# Patient Record
Sex: Female | Born: 1935 | Race: White | Hispanic: No | Marital: Single
Health system: Southern US, Community
[De-identification: ages and names within clinical notes are randomized; demographics above are authoritative.]

## PROBLEM LIST (undated history)

## (undated) DIAGNOSIS — F32A Depression, unspecified: Secondary | ICD-10-CM

## (undated) DIAGNOSIS — K219 Gastro-esophageal reflux disease without esophagitis: Secondary | ICD-10-CM

## (undated) DIAGNOSIS — F0390 Unspecified dementia without behavioral disturbance: Secondary | ICD-10-CM

## (undated) DIAGNOSIS — I639 Cerebral infarction, unspecified: Secondary | ICD-10-CM

## (undated) DIAGNOSIS — R1310 Dysphagia, unspecified: Secondary | ICD-10-CM

## (undated) DIAGNOSIS — H3530 Unspecified macular degeneration: Secondary | ICD-10-CM

## (undated) DIAGNOSIS — F329 Major depressive disorder, single episode, unspecified: Secondary | ICD-10-CM

## (undated) DIAGNOSIS — I10 Essential (primary) hypertension: Secondary | ICD-10-CM

## (undated) DIAGNOSIS — C801 Malignant (primary) neoplasm, unspecified: Secondary | ICD-10-CM

## (undated) DIAGNOSIS — J449 Chronic obstructive pulmonary disease, unspecified: Secondary | ICD-10-CM

## (undated) DIAGNOSIS — I4891 Unspecified atrial fibrillation: Secondary | ICD-10-CM

---

## 2004-03-15 ENCOUNTER — Inpatient Hospital Stay: Payer: Self-pay | Admitting: Surgery

## 2004-03-15 ENCOUNTER — Other Ambulatory Visit: Payer: Self-pay

## 2004-09-05 ENCOUNTER — Ambulatory Visit: Payer: Self-pay | Admitting: Ophthalmology

## 2005-05-03 ENCOUNTER — Ambulatory Visit: Payer: Self-pay | Admitting: Unknown Physician Specialty

## 2005-07-11 ENCOUNTER — Ambulatory Visit: Payer: Self-pay | Admitting: Family Medicine

## 2005-07-25 ENCOUNTER — Ambulatory Visit: Payer: Self-pay | Admitting: Family Medicine

## 2006-07-04 ENCOUNTER — Inpatient Hospital Stay: Payer: Self-pay | Admitting: Internal Medicine

## 2006-07-04 ENCOUNTER — Other Ambulatory Visit: Payer: Self-pay

## 2006-07-13 ENCOUNTER — Inpatient Hospital Stay: Payer: Self-pay | Admitting: Internal Medicine

## 2007-04-25 ENCOUNTER — Ambulatory Visit: Payer: Self-pay | Admitting: Family Medicine

## 2007-06-07 ENCOUNTER — Ambulatory Visit: Payer: Self-pay | Admitting: Ophthalmology

## 2007-06-13 ENCOUNTER — Ambulatory Visit: Payer: Self-pay | Admitting: Unknown Physician Specialty

## 2008-07-12 ENCOUNTER — Inpatient Hospital Stay: Payer: Self-pay | Admitting: Internal Medicine

## 2008-12-13 ENCOUNTER — Inpatient Hospital Stay: Payer: Self-pay | Admitting: Internal Medicine

## 2008-12-13 IMAGING — CR DG CHEST 1V PORT
1 series · 1 of 1 positions shown · non-contrast
Comparison: none

REASON FOR EXAM: sob  chest pain  [HOSPITAL]
COMMENTS:

PROCEDURE:     DXR - DXR PORTABLE CHEST SINGLE VIEW  - July 04, 2006  [DATE]
RESULT:     An AP portable exam reveals the cardiomediastinal structures to
be within normal limits.  The lung fields are clear. Vascularity is within
normal limits with no effusions present.

[view not recorded]
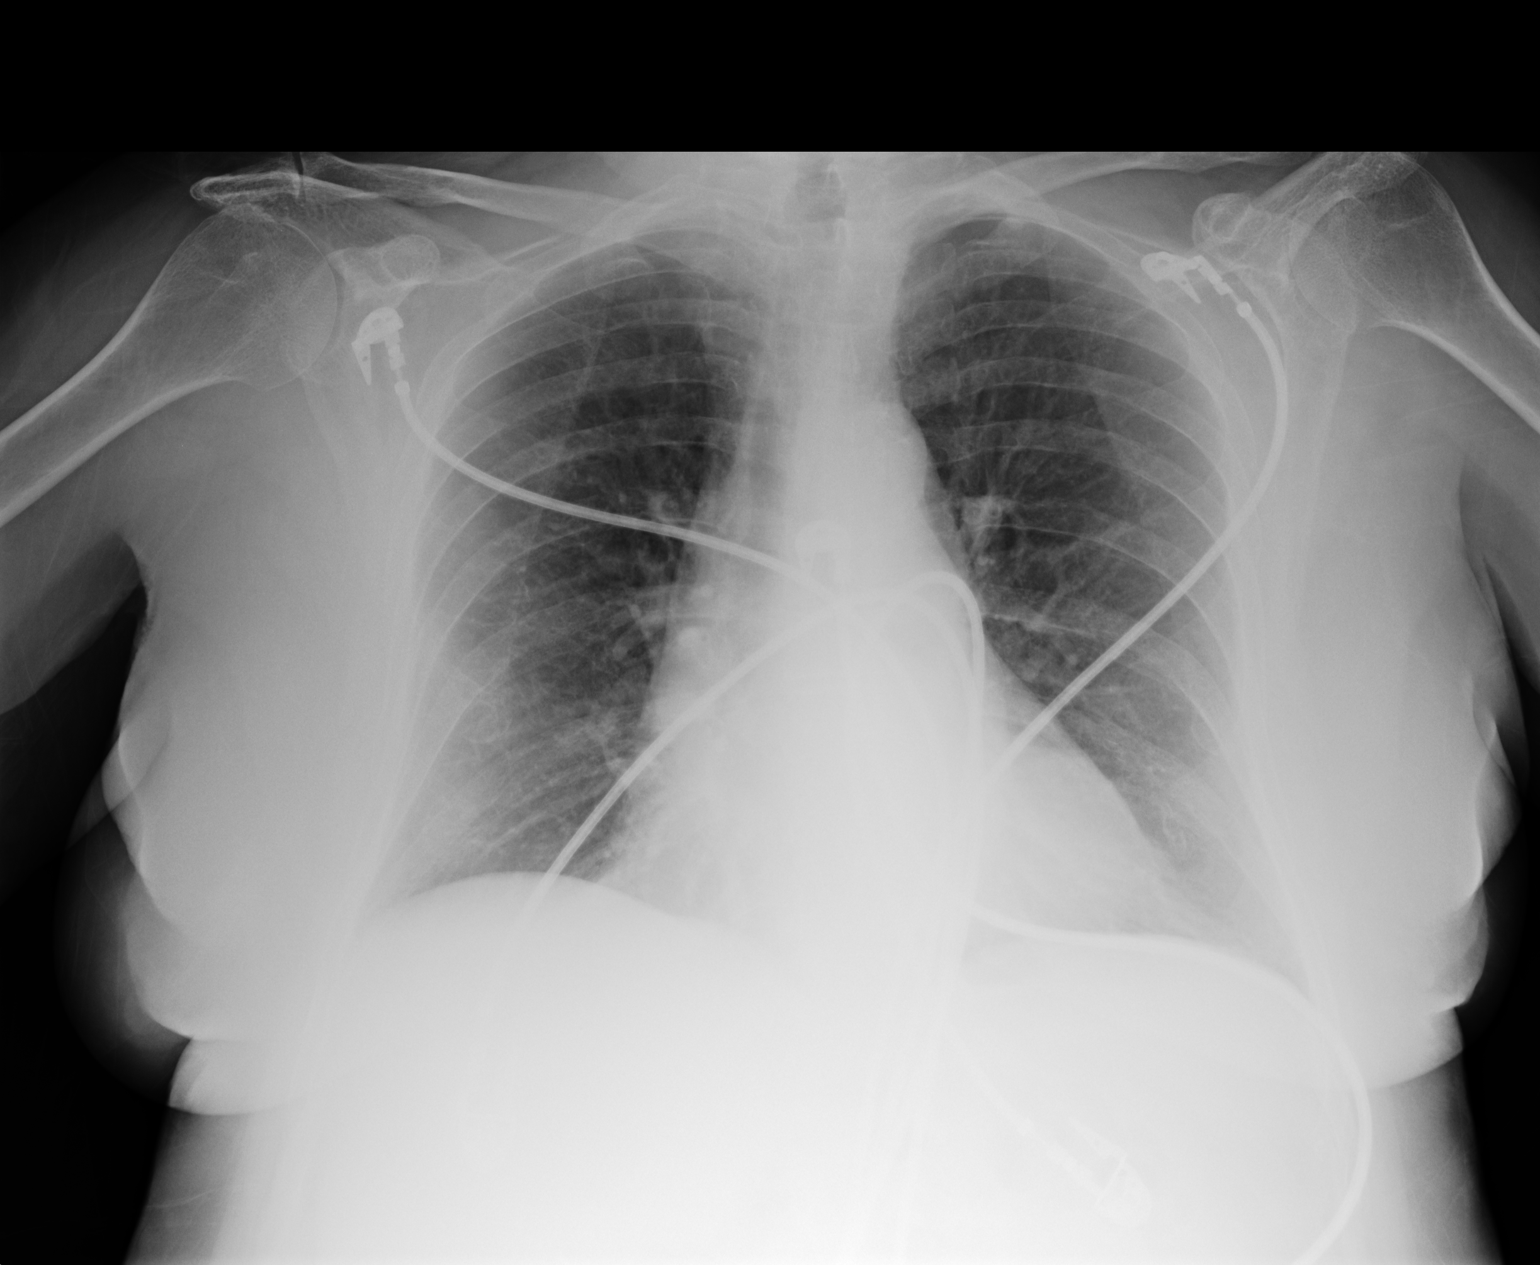

[1 of 1 positions shown; findings below may reference images not displayed]

IMPRESSION: 1)No acute cardiopulmonary disease identified.

## 2010-04-05 ENCOUNTER — Emergency Department: Payer: Self-pay | Admitting: Emergency Medicine

## 2012-10-09 ENCOUNTER — Emergency Department: Payer: Self-pay | Admitting: Emergency Medicine

## 2012-10-09 LAB — BASIC METABOLIC PANEL
BUN: 24 mg/dL — ABNORMAL HIGH (ref 7–18)
Calcium, Total: 9.3 mg/dL (ref 8.5–10.1)
Chloride: 95 mmol/L — ABNORMAL LOW (ref 98–107)
Co2: 32 mmol/L (ref 21–32)
EGFR (African American): 32 — ABNORMAL LOW
Glucose: 222 mg/dL — ABNORMAL HIGH (ref 65–99)
Potassium: 3.2 mmol/L — ABNORMAL LOW (ref 3.5–5.1)
Sodium: 135 mmol/L — ABNORMAL LOW (ref 136–145)

## 2012-10-09 LAB — URINALYSIS, COMPLETE
Bacteria: NONE SEEN
Bilirubin,UR: NEGATIVE
Hyaline Cast: 1
Leukocyte Esterase: NEGATIVE
Nitrite: NEGATIVE
Ph: 6 (ref 4.5–8.0)
Protein: 30
RBC,UR: 2 /HPF (ref 0–5)
Squamous Epithelial: NONE SEEN

## 2012-10-09 LAB — HEPATIC FUNCTION PANEL A (ARMC)
Albumin: 3.3 g/dL — ABNORMAL LOW (ref 3.4–5.0)
Bilirubin,Total: 0.3 mg/dL (ref 0.2–1.0)
SGOT(AST): 43 U/L — ABNORMAL HIGH (ref 15–37)
SGPT (ALT): 24 U/L (ref 12–78)
Total Protein: 8 g/dL (ref 6.4–8.2)

## 2012-10-09 LAB — CBC
HCT: 35.3 % (ref 35.0–47.0)
HGB: 12.1 g/dL (ref 12.0–16.0)
MCH: 33.4 pg (ref 26.0–34.0)
MCHC: 34.2 g/dL (ref 32.0–36.0)
MCV: 98 fL (ref 80–100)
Platelet: 433 10*3/uL (ref 150–440)
WBC: 13.6 10*3/uL — ABNORMAL HIGH (ref 3.6–11.0)

## 2014-12-23 ENCOUNTER — Encounter: Payer: Self-pay | Admitting: Emergency Medicine

## 2014-12-23 ENCOUNTER — Emergency Department
Admission: EM | Admit: 2014-12-23 | Discharge: 2014-12-24 | Disposition: A | Discharge status: Home / Self Care | Payer: Medicare PPO | Attending: Emergency Medicine | Admitting: Emergency Medicine

## 2014-12-23 DIAGNOSIS — R339 Retention of urine, unspecified: Secondary | ICD-10-CM | POA: Insufficient documentation

## 2014-12-23 DIAGNOSIS — R944 Abnormal results of kidney function studies: Secondary | ICD-10-CM | POA: Diagnosis not present

## 2014-12-23 DIAGNOSIS — R3914 Feeling of incomplete bladder emptying: Secondary | ICD-10-CM | POA: Diagnosis not present

## 2014-12-23 DIAGNOSIS — F0390 Unspecified dementia without behavioral disturbance: Secondary | ICD-10-CM | POA: Diagnosis not present

## 2014-12-23 DIAGNOSIS — I10 Essential (primary) hypertension: Secondary | ICD-10-CM | POA: Insufficient documentation

## 2014-12-23 DIAGNOSIS — R251 Tremor, unspecified: Secondary | ICD-10-CM | POA: Diagnosis present

## 2014-12-23 DIAGNOSIS — R338 Other retention of urine: Secondary | ICD-10-CM

## 2014-12-23 DIAGNOSIS — Z880 Allergy status to penicillin: Secondary | ICD-10-CM | POA: Diagnosis not present

## 2014-12-23 HISTORY — DX: Cerebral infarction, unspecified: I63.9

## 2014-12-23 HISTORY — DX: Essential (primary) hypertension: I10

## 2014-12-23 HISTORY — DX: Chronic obstructive pulmonary disease, unspecified: J44.9

## 2014-12-23 HISTORY — DX: Gastro-esophageal reflux disease without esophagitis: K21.9

## 2014-12-23 HISTORY — DX: Major depressive disorder, single episode, unspecified: F32.9

## 2014-12-23 HISTORY — DX: Malignant (primary) neoplasm, unspecified: C80.1

## 2014-12-23 HISTORY — DX: Unspecified atrial fibrillation: I48.91

## 2014-12-23 HISTORY — DX: Dysphagia, unspecified: R13.10

## 2014-12-23 HISTORY — DX: Unspecified macular degeneration: H35.30

## 2014-12-23 HISTORY — DX: Unspecified dementia, unspecified severity, without behavioral disturbance, psychotic disturbance, mood disturbance, and anxiety: F03.90

## 2014-12-23 HISTORY — DX: Depression, unspecified: F32.A

## 2014-12-23 LAB — CBC WITH DIFFERENTIAL/PLATELET
BASOS ABS: 0 10*3/uL (ref 0–0.1)
Basophils Relative: 1 %
EOS PCT: 2 %
Eosinophils Absolute: 0.1 10*3/uL (ref 0–0.7)
HEMATOCRIT: 35.2 % (ref 35.0–47.0)
Hemoglobin: 11.6 g/dL — ABNORMAL LOW (ref 12.0–16.0)
LYMPHS PCT: 22 %
Lymphs Abs: 1.5 10*3/uL (ref 1.0–3.6)
MCH: 33.7 pg (ref 26.0–34.0)
MCHC: 32.9 g/dL (ref 32.0–36.0)
MCV: 102.5 fL — AB (ref 80.0–100.0)
Monocytes Absolute: 0.8 10*3/uL (ref 0.2–0.9)
Monocytes Relative: 12 %
Neutro Abs: 4.6 10*3/uL (ref 1.4–6.5)
Neutrophils Relative %: 63 %
Platelets: 319 10*3/uL (ref 150–440)
RBC: 3.43 MIL/uL — AB (ref 3.80–5.20)
RDW: 15.9 % — AB (ref 11.5–14.5)
WBC: 7.1 10*3/uL (ref 3.6–11.0)

## 2014-12-23 LAB — URINALYSIS COMPLETE WITH MICROSCOPIC (ARMC ONLY)
Bacteria, UA: NONE SEEN
Bilirubin Urine: NEGATIVE
GLUCOSE, UA: NEGATIVE mg/dL
HGB URINE DIPSTICK: NEGATIVE
Ketones, ur: NEGATIVE mg/dL
Leukocytes, UA: NEGATIVE
Nitrite: NEGATIVE
PH: 5 (ref 5.0–8.0)
Protein, ur: NEGATIVE mg/dL
RBC / HPF: NONE SEEN RBC/hpf (ref 0–5)
SQUAMOUS EPITHELIAL / LPF: NONE SEEN
Specific Gravity, Urine: 1.014 (ref 1.005–1.030)

## 2014-12-23 LAB — BASIC METABOLIC PANEL
Anion gap: 10 (ref 5–15)
BUN: 31 mg/dL — AB (ref 6–20)
CO2: 30 mmol/L (ref 22–32)
Calcium: 8.8 mg/dL — ABNORMAL LOW (ref 8.9–10.3)
Chloride: 99 mmol/L — ABNORMAL LOW (ref 101–111)
Creatinine, Ser: 1.8 mg/dL — ABNORMAL HIGH (ref 0.44–1.00)
GFR, EST AFRICAN AMERICAN: 27 mL/min — AB (ref >60)
GFR, EST NON AFRICAN AMERICAN: 23 mL/min — AB (ref >60)
Glucose, Bld: 166 mg/dL — ABNORMAL HIGH (ref 65–99)
Potassium: 4.8 mmol/L (ref 3.5–5.1)
Sodium: 139 mmol/L (ref 135–145)

## 2014-12-23 MED ORDER — METOPROLOL TARTRATE 25 MG PO TABS
25.0000 mg | ORAL_TABLET | Freq: Once | ORAL | Status: AC
  Administered 2014-12-23: 25 mg via ORAL

## 2014-12-23 MED ORDER — METOPROLOL TARTRATE 25 MG PO TABS
ORAL_TABLET | ORAL | Status: AC
  Filled 2014-12-23: qty 1

## 2014-12-23 MED ORDER — SODIUM CHLORIDE 0.9 % IV BOLUS (SEPSIS)
1000.0000 mL | Freq: Once | INTRAVENOUS | Status: AC
  Administered 2014-12-23: 1000 mL via INTRAVENOUS

## 2014-12-23 MED ORDER — SODIUM CHLORIDE 0.9 % IV SOLN
Freq: Once | INTRAVENOUS | Status: AC
  Administered 2014-12-23: 20:00:00 via INTRAVENOUS

## 2014-12-23 NOTE — ED Notes (Signed)
Pts niece Manuela Schwartz called and informed of pts D/C.

## 2014-12-23 NOTE — ED Provider Notes (Signed)
[facility] Emergency Department Provider Note    ____________________________________________  Time seen: 1500  I have reviewed the triage vital signs and the nursing notes.   HISTORY  Chief Complaint Tremors and Urinary Retention   History limited by: dementia   HPI Janet Smith is a 81 y.o. female who presents to the emergency department today from nursing facility because of concerns for altered mental status and decreased urination. Per report patient has not urinated in the past 24 hours. Additionally the patient has developed tremors throughout her body for the past 2 days. Patient's niece states that when she saw her 3 days ago the patient seemed to be doing okay. The patient does not appear to be in any pain. No recent fevers appreciated.  Past Medical History  Diagnosis Date  . Hypertension   . A-fib   . GERD (gastroesophageal reflux disease)   . Chronic airway obstruction   . Dysphagia   . CVA (cerebral infarction)   . Cancer     colon hx  . Macular degeneration   . Depression   . Dementia     There are no active problems to display for this patient.   History reviewed. No pertinent past surgical history.  No current outpatient prescriptions on file.  Allergies Codeine; Eggs or egg-derived products; Fish allergy; Influenza vaccines; Penicillins; and Sulfa antibiotics  History reviewed. No pertinent family history.  Social History Social History  Substance Use Topics  . Smoking status: Unknown If Ever Smoked  . Smokeless tobacco: None  . Alcohol Use: None    Review of Systems Unable to obtain secondary to dementia  ____________________________________________   PHYSICAL EXAM:  VITAL SIGNS: ED Triage Vitals  Enc Vitals Group     BP 12/23/14 1353 125/89 mmHg     Pulse Rate 12/23/14 1353 54     Resp 12/23/14 1353 18     Temp 12/23/14 1353 98.4 F (36.9 C)     Temp Source 12/23/14 1353 Oral     SpO2 12/23/14 1353  99 %   Constitutional: Awake and alert. Appears to be in no pain. Eyes: Conjunctivae are normal. PERRL. Normal extraocular movements. ENT   Head: Normocephalic and atraumatic.   Nose: No congestion/rhinnorhea.   Mouth/Throat: Mucous membranes are moist.   Neck: No stridor. Hematological/Lymphatic/Immunilogical: No cervical lymphadenopathy. Cardiovascular: Normal rate, regular rhythm.  No murmurs, rubs, or gallops. Respiratory: Normal respiratory effort without tachypnea nor retractions. Breath sounds are clear and equal bilaterally. No wheezes/rales/rhonchi. Gastrointestinal: Soft and nontender. No distention.  Genitourinary: Deferred Musculoskeletal: Normal range of motion in all extremities. No joint effusions.  No lower extremity tenderness nor edema. Neurologic:  Demented. Nonverbal. Tremors in all extremities appreciated. Skin:  Skin is warm, dry and intact. No rash noted. Psychiatric: Mood and affect are normal. Speech and behavior are normal. Patient exhibits appropriate insight and judgment.  ____________________________________________    LABS (pertinent positives/negatives)  Cr: 1.8 WBC: 7.1  ____________________________________________   EKG  None  ____________________________________________    RADIOLOGY  None  ____________________________________________   PROCEDURES  Procedure(s) performed: None  Critical Care performed: No  ____________________________________________   INITIAL IMPRESSION / ASSESSMENT AND PLAN / ED COURSE  Pertinent labs & imaging results that were available during my care of the patient were reviewed by me and considered in my medical decision making (see chart for details).  Patient presented to the emergency department today with concerns for urinary retention and tremors. On exam patient appears in no  distress. No distended bladder appreciated. Patient was given fluids and multiple bladder scans were performed.  At the time of sign out we were still waiting patient to urinate. Creatinine appears to be roughly patient's baseline.  ____________________________________________   FINAL CLINICAL IMPRESSION(S) / ED DIAGNOSES  Final diagnoses:  Acute urinary retention     Nance Pear, MD 12/24/14 [phone]

## 2014-12-23 NOTE — Discharge Instructions (Signed)
Acute Urinary Retention °Acute urinary retention is the temporary inability to urinate. This is an uncommon problem in women. It can be caused by: °· Infection. °· A side effect of a medicine. °· A problem in a nearby organ that presses or squeezes on the bladder or the urethra (the tube that drains the bladder). °· Psychological problems. °·  Surgery on your bladder, urethra, or pelvic organs that causes obstruction to the outflow of urine from your bladder. °HOME CARE INSTRUCTIONS  °If you are sent home with a Foley catheter and a drainage system, you will need to discuss the best course of action with your health care provider. While the catheter is in, maintain a good intake of fluids. Keep the drainage bag emptied and lower than your catheter. This is so that contaminated urine will not flow back into your bladder, which could lead to a urinary tract infection. °There are two main types of drainage bags. One is a large bag that usually is used at night. It has a good capacity that will allow you to sleep through the night without having to empty it. The second type is called a leg bag. It has a smaller capacity so it needs to be emptied more frequently. However, the main advantage is that it can be attached by a leg strap and goes underneath your clothing, allowing you the freedom to move about or leave your home. °Only take over-the-counter or prescription medicines for pain, discomfort, or fever as directed by your health care provider.  °SEEK MEDICAL CARE IF: °· You develop a low-grade fever. °· You experience spasms or leakage of urine with the spasms. °SEEK IMMEDIATE MEDICAL CARE IF:  °· You develop chills or fever. °· Your catheter stops draining urine. °· Your catheter falls out. °· You start to develop increased bleeding that does not respond to rest and increased fluid intake. °MAKE SURE YOU: °· Understand these instructions. °· Will watch your condition. °· Will get help right away if you are not  doing well or get worse. °Document Released: 04/30/2006 Document Revised: 02/19/2013 Document Reviewed: 10/10/2012 °[employer]® Patient Information ©2015 [employer]. This information is not intended to replace advice given to you by your health care provider. Make sure you discuss any questions you have with your health care provider. ° °

## 2014-12-23 NOTE — Consult Note (Signed)
Consult: Urinary retention Requested by: Dr. Archie Balboa  History of Present Illness: 81 year old white female with dementia who is noted to have decreased urine output and possibly no voiding for about 24 hours. Her chemistry showed a slight bump in her BUN and creatinine indicating possible prerenal causes. She was given 2 L of fluid and had not voided. Nurses tried to catheter 4. Her bladder scan was about 500. She was altered over to the commode and sat down. She tried to void and voided about 50 mL.  Past Medical History  Diagnosis Date  . Hypertension   . A-fib   . GERD (gastroesophageal reflux disease)   . Chronic airway obstruction   . Dysphagia   . CVA (cerebral infarction)   . Cancer     colon hx  . Macular degeneration   . Depression   . Dementia    History reviewed. No pertinent past surgical history.  Home Medications:   (Not in a hospital admission) Allergies:  Allergies  Allergen Reactions  . Codeine Other (See Comments)    Reaction:  Unknown   . Eggs Or Egg-Derived Products Other (See Comments)    Reaction:  Unknown   . Fish Allergy Other (See Comments)    Reaction:  Unknown   . Influenza Vaccines Other (See Comments)    Reaction:  Unknown   . Penicillins Other (See Comments)    Reaction:  Unknown   . Sulfa Antibiotics Other (See Comments)    Reaction:  Unknown     History reviewed. No pertinent family history. Social History:  has no tobacco, alcohol, and drug history on file.  ROS: A complete review of systems was performed.  All systems are negative except for pertinent findings as noted. ROS   Physical Exam:  Vital signs in last 24 hours: Temp:  [98.4 F (36.9 C)] 98.4 F (36.9 C) (08/10 1851) Pulse Rate:  [52-142] 52 (08/10 2045) Resp:  [16-24] 17 (08/10 2100) BP: (110-217)/(66-170) 217/133 mmHg (08/10 2100) SpO2:  [84 %-100 %] 100 % (08/10 2045) General:  Alert and oriented, No acute distress HEENT: Normocephalic, atraumatic Neck: No JVD  or lymphadenopathy Cardiovascular: Regular rate and rhythm Lungs: Regular rate and effort Abdomen: Soft, nontender, nondistended, no abdominal masses Back: No CVA tenderness Extremities: No edema Neurologic: Grossly intact GU: 2 nurses and one tech assisted and were chaperones, findings: Typical severe atrophic vaginitis some retraction of urethral meatus under pubic bone. The vulva was otherwise normal in appearance without lesion or mass. The introitus was quite tight. With one finger in the vagina she was prepped and a 16 French catheter was inserted without difficulty. The meatus was palpably normal. The bladder and vagina were palpably normal. About 400 mL of clear urine was drained and the catheter was removed.   Laboratory Data:  Results for orders placed or performed during the hospital encounter of 12/23/14 (from the past 24 hour(s))  CBC with Differential     Status: Abnormal   Collection Time: 12/23/14  3:40 PM  Result Value Ref Range   WBC 7.1 3.6 - 11.0 K/uL   RBC 3.43 (L) 3.80 - 5.20 MIL/uL   Hemoglobin 11.6 (L) 12.0 - 16.0 g/dL   HCT 35.2 35.0 - 47.0 %   MCV 102.5 (H) 80.0 - 100.0 fL   MCH 33.7 26.0 - 34.0 pg   MCHC 32.9 32.0 - 36.0 g/dL   RDW 15.9 (H) 11.5 - 14.5 %   Platelets 319 150 - 440 K/uL  Neutrophils Relative % 63 %   Neutro Abs 4.6 1.4 - 6.5 K/uL   Lymphocytes Relative 22 %   Lymphs Abs 1.5 1.0 - 3.6 K/uL   Monocytes Relative 12 %   Monocytes Absolute 0.8 0.2 - 0.9 K/uL   Eosinophils Relative 2 %   Eosinophils Absolute 0.1 0 - 0.7 K/uL   Basophils Relative 1 %   Basophils Absolute 0.0 0 - 0.1 K/uL  Basic metabolic panel     Status: Abnormal   Collection Time: 12/23/14  3:40 PM  Result Value Ref Range   Sodium 139 135 - 145 mmol/L   Potassium 4.8 3.5 - 5.1 mmol/L   Chloride 99 (L) 101 - 111 mmol/L   CO2 30 22 - 32 mmol/L   Glucose, Bld 166 (H) 65 - 99 mg/dL   BUN 31 (H) 6 - 20 mg/dL   Creatinine, Ser 1.80 (H) 0.44 - 1.00 mg/dL   Calcium 8.8 (L) 8.9  - 10.3 mg/dL   GFR calc non Af Amer 23 (L) >60 mL/min   GFR calc Af Amer 27 (L) >60 mL/min   Anion gap 10 5 - 15   No results found for this or any previous visit (from the past 240 hour(s)). Creatinine:  Recent Labs  12/23/14 1540  CREATININE 1.80*    Impression/Assessment/plan: Infrequent voiding, possible incomplete bladder emptying-prior to and and out cath patient did not appear to be in any acute distress or discomfort. Her bump in creatinine may been simply due to some dehydration. Also, her normal bladder function may include an elevated post void that does not cause her any trouble. I do not feel like we need to leave a catheter in at the current time. A urinalysis and urine culture will be sent. Patient needs to be encouraged to drink fluids and stay hydrated. She may need repeat in and out cath if she decompensates with obvious acute renal failure, hydronephrosis, UTI, or become symptomatic (urgency to void with bladder pain and inability to urinate) and a significantly elevated PVR. She has normal anatomy and I/O cath should not be an issue.    Lelaina Oatis 12/23/2014, 10:10 PM

## 2014-12-23 NOTE — ED Notes (Signed)
Pt here via EMS from [facility] where she was noted to have a tremor today and was reported to have not urinated for 24 hours. Pt with baseline dementia and mobility probems, NAD, VSS and afebrile. Alert to self and location.

## 2014-12-23 NOTE — ED Provider Notes (Signed)
-----------------------------------------   10:22 PM on 12/23/2014 -----------------------------------------  Patient has been seen by urology, and an in and out catheter was performed with approximately 400 cc of urine removed. Kidney function appears within normal limits. I discussed with urology, they do not recommend an indwelling Foley catheter at this time. We'll discharge the patient home, with the understanding that the patient is to return to the department if she is unable to urinate, developed abdominal pain. Patient/family are agreeable to this plan.  Harvest Dark, MD 12/23/14 2223

## 2015-03-24 ENCOUNTER — Encounter: Payer: Self-pay | Admitting: Emergency Medicine

## 2015-03-24 ENCOUNTER — Emergency Department: Payer: Medicare PPO

## 2015-03-24 ENCOUNTER — Emergency Department
Admission: EM | Admit: 2015-03-24 | Discharge: 2015-03-25 | Disposition: A | Discharge status: Home Health | Payer: Medicare PPO | Attending: Emergency Medicine | Admitting: Emergency Medicine

## 2015-03-24 DIAGNOSIS — Z7901 Long term (current) use of anticoagulants: Secondary | ICD-10-CM | POA: Insufficient documentation

## 2015-03-24 DIAGNOSIS — I10 Essential (primary) hypertension: Secondary | ICD-10-CM | POA: Diagnosis not present

## 2015-03-24 DIAGNOSIS — Z880 Allergy status to penicillin: Secondary | ICD-10-CM | POA: Diagnosis not present

## 2015-03-24 DIAGNOSIS — Z7951 Long term (current) use of inhaled steroids: Secondary | ICD-10-CM | POA: Insufficient documentation

## 2015-03-24 DIAGNOSIS — N390 Urinary tract infection, site not specified: Secondary | ICD-10-CM | POA: Insufficient documentation

## 2015-03-24 DIAGNOSIS — F911 Conduct disorder, childhood-onset type: Secondary | ICD-10-CM | POA: Diagnosis not present

## 2015-03-24 DIAGNOSIS — Z79899 Other long term (current) drug therapy: Secondary | ICD-10-CM | POA: Insufficient documentation

## 2015-03-24 DIAGNOSIS — R4182 Altered mental status, unspecified: Secondary | ICD-10-CM | POA: Insufficient documentation

## 2015-03-24 DIAGNOSIS — R109 Unspecified abdominal pain: Secondary | ICD-10-CM | POA: Diagnosis present

## 2015-03-24 LAB — COMPREHENSIVE METABOLIC PANEL
ALT: 13 U/L — AB (ref 14–54)
AST: 24 U/L (ref 15–41)
Albumin: 3.2 g/dL — ABNORMAL LOW (ref 3.5–5.0)
Alkaline Phosphatase: 60 U/L (ref 38–126)
Anion gap: 3 — ABNORMAL LOW (ref 5–15)
BUN: 15 mg/dL (ref 6–20)
CHLORIDE: 105 mmol/L (ref 101–111)
CO2: 31 mmol/L (ref 22–32)
Calcium: 8.5 mg/dL — ABNORMAL LOW (ref 8.9–10.3)
Creatinine, Ser: 1.19 mg/dL — ABNORMAL HIGH (ref 0.44–1.00)
GFR calc Af Amer: 45 mL/min — ABNORMAL LOW (ref >60)
GFR calc non Af Amer: 38 mL/min — ABNORMAL LOW (ref >60)
GLUCOSE: 86 mg/dL (ref 65–99)
POTASSIUM: 4 mmol/L (ref 3.5–5.1)
Sodium: 139 mmol/L (ref 135–145)
Total Bilirubin: 0.4 mg/dL (ref 0.3–1.2)
Total Protein: 6.2 g/dL — ABNORMAL LOW (ref 6.5–8.1)

## 2015-03-24 LAB — CBC WITH DIFFERENTIAL/PLATELET
Basophils Absolute: 0.1 10*3/uL (ref 0–0.1)
Basophils Relative: 1 %
EOS ABS: 0.2 10*3/uL (ref 0–0.7)
EOS PCT: 2 %
HCT: 30.7 % — ABNORMAL LOW (ref 35.0–47.0)
Hemoglobin: 10.2 g/dL — ABNORMAL LOW (ref 12.0–16.0)
LYMPHS ABS: 1.8 10*3/uL (ref 1.0–3.6)
Lymphocytes Relative: 20 %
MCH: 34.9 pg — AB (ref 26.0–34.0)
MCHC: 33.3 g/dL (ref 32.0–36.0)
MCV: 104.8 fL — ABNORMAL HIGH (ref 80.0–100.0)
Monocytes Absolute: 1.5 10*3/uL — ABNORMAL HIGH (ref 0.2–0.9)
Monocytes Relative: 17 %
Neutro Abs: 5.3 10*3/uL (ref 1.4–6.5)
Neutrophils Relative %: 60 %
PLATELETS: 247 10*3/uL (ref 150–440)
RBC: 2.93 MIL/uL — AB (ref 3.80–5.20)
RDW: 14.7 % — AB (ref 11.5–14.5)
WBC: 8.8 10*3/uL (ref 3.6–11.0)

## 2015-03-24 LAB — LIPASE, BLOOD: Lipase: 41 U/L (ref 11–51)

## 2015-03-24 LAB — TROPONIN I
TROPONIN I: 0.03 ng/mL (ref <0.031)
Troponin I: 0.03 ng/mL (ref <0.031)

## 2015-03-24 LAB — URINALYSIS COMPLETE WITH MICROSCOPIC (ARMC ONLY)
Bacteria, UA: NONE SEEN
Bilirubin Urine: NEGATIVE
GLUCOSE, UA: NEGATIVE mg/dL
Hgb urine dipstick: NEGATIVE
Ketones, ur: NEGATIVE mg/dL
Nitrite: NEGATIVE
Protein, ur: NEGATIVE mg/dL
SPECIFIC GRAVITY, URINE: 1.005 (ref 1.005–1.030)
Squamous Epithelial / LPF: NONE SEEN
pH: 6 (ref 5.0–8.0)

## 2015-03-24 LAB — PROTIME-INR
INR: 2.19
PROTHROMBIN TIME: 24.2 s — AB (ref 11.4–15.0)

## 2015-03-24 LAB — LACTIC ACID, PLASMA: LACTIC ACID, VENOUS: 1.6 mmol/L (ref 0.5–2.0)

## 2015-03-24 MED ORDER — HALOPERIDOL 5 MG PO TABS
5.0000 mg | ORAL_TABLET | Freq: Once | ORAL | Status: AC
  Administered 2015-03-24: 5 mg via ORAL
  Filled 2015-03-24: qty 1

## 2015-03-24 MED ORDER — IOHEXOL 240 MG/ML SOLN
25.0000 mL | Freq: Once | INTRAMUSCULAR | Status: AC | PRN
  Administered 2015-03-24: 25 mL via ORAL

## 2015-03-24 MED ORDER — HALOPERIDOL 0.5 MG PO TABS
0.5000 mg | ORAL_TABLET | Freq: Once | ORAL | Status: AC
  Administered 2015-03-24: 0.5 mg via ORAL
  Filled 2015-03-24: qty 1

## 2015-03-24 MED ORDER — LORAZEPAM 1 MG PO TABS
1.0000 mg | ORAL_TABLET | Freq: Once | ORAL | Status: AC
  Administered 2015-03-24: 1 mg via ORAL

## 2015-03-24 MED ORDER — IOHEXOL 300 MG/ML  SOLN
75.0000 mL | Freq: Once | INTRAMUSCULAR | Status: AC | PRN
  Administered 2015-03-24: 75 mL via INTRAVENOUS

## 2015-03-24 MED ORDER — LORAZEPAM 1 MG PO TABS
ORAL_TABLET | ORAL | Status: AC
  Filled 2015-03-24: qty 1

## 2015-03-24 MED ORDER — LORAZEPAM 1 MG PO TABS
1.0000 mg | ORAL_TABLET | Freq: Once | ORAL | Status: AC
  Administered 2015-03-24: 1 mg via ORAL
  Filled 2015-03-24: qty 1

## 2015-03-24 MED ORDER — CIPROFLOXACIN IN D5W 400 MG/200ML IV SOLN
400.0000 mg | Freq: Once | INTRAVENOUS | Status: AC
  Administered 2015-03-24: 400 mg via INTRAVENOUS
  Filled 2015-03-24: qty 200

## 2015-03-24 MED ORDER — CIPROFLOXACIN HCL 500 MG PO TABS: 500.0000 mg | ORAL_TABLET | Freq: Two times a day (BID) | ORAL | Status: AC

## 2015-03-24 NOTE — ED Notes (Signed)
Patient currently refusing vital signs. "You aren't touching me. Get away. Hurry up."

## 2015-03-24 NOTE — ED Notes (Signed)
Gwenlyn Fudge, patients niece, called to ask to be called/notified regarding plan of care (Discharge or Admitted) when that is determined. [phone]

## 2015-03-24 NOTE — ED Notes (Signed)
Niece called and updated on patient's plan of care.

## 2015-03-24 NOTE — ED Notes (Signed)
X-Ray at bedside.

## 2015-03-24 NOTE — ED Notes (Signed)
Spoke with Dr. Cinda Quest. OK to wait until 1630 for medications to try and relax patient. Niece at bedside. Lighting reduced and soothing music in room.

## 2015-03-24 NOTE — ED Provider Notes (Signed)
[facility] Emergency Department Provider Note  ____________________________________________  Time seen: Approximately 3:26 PM  I have reviewed the triage vital signs and the nursing notes.   HISTORY  Chief Complaint Altered Mental Status    HPI Janet Smith is a 81 y.o. female patient sent from [facility] because she has all of a sudden become very aggressive yelling and cussing at staff and swatting at them. This is completely unlike her. She does not have any apparent fever cough weakness facial droop or any other complaints. After speaking with her for some time. Which she alternated between being compliant and talking to me and swatting at me she appears to have some abdominal pain. I'm not sure where in her belly hurts. She also does not appear to be using her left arm as much although if you ask her to squeeze your fingers etc. she seems to have equal strength bilaterally.   Past Medical History  Diagnosis Date  . Hypertension   . A-fib ([place])   . GERD (gastroesophageal reflux disease)   . Chronic airway obstruction ([place])   . Dysphagia   . CVA (cerebral infarction)   . Cancer ([place])     colon hx  . Macular degeneration   . Depression   . Dementia     There are no active problems to display for this patient.   History reviewed. No pertinent past surgical history.  Current Outpatient Rx  Name  Route  Sig  Dispense  Refill  . acetaminophen (TYLENOL) 325 MG tablet   Oral   Take 650 mg by mouth every 4 (four) hours as needed for mild pain or fever.         Marland Kitchen amLODipine (NORVASC) 10 MG tablet   Oral   Take 10 mg by mouth daily.         . bisacodyl (DULCOLAX) 10 MG suppository   Rectal   Place 10 mg rectally as needed for moderate constipation.         . ciprofloxacin (CIPRO) 500 MG tablet   Oral   Take 1 tablet (500 mg total) by mouth 2 (two) times daily.   9 tablet   0   . citalopram (CELEXA) 10 MG tablet   Oral   Take 5 mg by  mouth at bedtime.         . Fluticasone-Salmeterol (ADVAIR) 100-50 MCG/DOSE AEPB   Inhalation   Inhale 1 puff into the lungs 2 (two) times daily.         Marland Kitchen levalbuterol (XOPENEX) 1.25 MG/3ML nebulizer solution   Nebulization   Take 1.25 mg by nebulization every 4 (four) hours as needed for shortness of breath.         . loperamide (IMODIUM) 2 MG capsule   Oral   Take 2-4 mg by mouth as needed for diarrhea or loose stools.         . magnesium oxide (MAG-OX) 400 MG tablet   Oral   Take 400 mg by mouth daily.         . memantine (NAMENDA) 5 MG tablet   Oral   Take 5 mg by mouth 2 (two) times daily.         . metoprolol tartrate (LOPRESSOR) 25 MG tablet   Oral   Take 25 mg by mouth 2 (two) times daily.         . mometasone (NASONEX) 50 MCG/ACT nasal spray   Nasal   Place 2 sprays  into the nose daily.         . Multiple Vitamin (THEREMS) TABS   Oral   Take 1 tablet by mouth daily.         . Multiple Vitamins-Minerals (PRESERVISION AREDS) TABS   Oral   Take 1 tablet by mouth daily.         . nizatidine (AXID) 150 MG capsule   Oral   Take 150 mg by mouth daily.         Marland Kitchen nystatin (MYCOSTATIN) powder   Topical   Apply 1 g topically 2 (two) times daily as needed (for rash).         Marland Kitchen omeprazole (PRILOSEC) 20 MG capsule   Oral   Take 20 mg by mouth 2 (two) times daily.         . polycarbophil (FIBERCON) 625 MG tablet   Oral   Take 1,250 mg by mouth daily.         . potassium chloride SA (K-DUR,KLOR-CON) 20 MEQ tablet   Oral   Take 20 mEq by mouth 2 (two) times daily.         . rivastigmine (EXELON) 9.5 mg/24hr   Transdermal   Place 9.5 mg onto the skin every evening.         . traMADol (ULTRAM) 50 MG tablet   Oral   Take 50 mg by mouth every 6 (six) hours as needed for moderate pain.         . vitamin B-12 (CYANOCOBALAMIN) 1000 MCG tablet   Oral   Take 1,000 mcg by mouth daily.         Marland Kitchen warfarin (COUMADIN) 2 MG tablet    Oral   Take 2 mg by mouth daily. Pt takes on Wednesday and Saturday.         . warfarin (COUMADIN) 3 MG tablet   Oral   Take 3 mg by mouth daily. Pt takes on Monday, Tuesday, Thursday, Friday, and Sunday.           Allergies Codeine; Eggs or egg-derived products; Fish allergy; Influenza vaccines; Penicillins; and Sulfa antibiotics  History reviewed. No pertinent family history.  Social History Social History  Substance Use Topics  . Smoking status: Unknown If Ever Smoked  . Smokeless tobacco: None  . Alcohol Use: None    Review of Systems Constitutional: No fever/chills Eyes: No visual changes. ENT: No sore throat. Cardiovascular: Denies chest pain. Respiratory: Denies shortness of breath. Gastrointestinal: No abdominal pain.  No nausea, no vomiting.  No diarrhea.  No constipation. Genitourinary: Negative for dysuria. Musculoskeletal: Negative for back pain. Skin: Negative for rash. Neurological: Negative for headaches, focal weakness or numbness.  10-point ROS otherwise negative.  ____________________________________________   PHYSICAL EXAM:  VITAL SIGNS: ED Triage Vitals  Enc Vitals Group     BP --      Pulse --      Resp --      Temp --      Temp src --      SpO2 03/24/15 1431 94 %     Weight --      Height --      Head Cir --      Peak Flow --      Pain Score 03/24/15 1436 8     Pain Loc --      Pain Edu? --      Excl. in [place]? --     Constitutional: Alert and oriented. Well appearing  and in no acute distress. Eyes: Conjunctivae are normal. PERRL. EOMI. Head: Atraumatic. Nose: No congestion/rhinnorhea. Mouth/Throat: Mucous membranes are moist.  Oropharynx non-erythematous. Neck: No stridor.   Cardiovascular: Normal rate, regular rhythm. Grossly normal heart sounds.  Good peripheral circulation. Respiratory: Normal respiratory effort.  No retractions. Lungs CTAB. Gastrointestinal: Soft and nontender. No distention. No abdominal bruits. No CVA  tenderness. Musculoskeletal: No lower extremity tenderness nor edema.  No joint effusions. Neurologic:  Normal speech and language. No gross focal neurologic deficits are appreciated. No gait instability. Skin:  Skin is warm, dry and intact. No rash noted. Psychiatric: Mood and affect are normal. Speech and behavior are normal. Rectal exam is normal Hemoccult-negative negative  ____________________________________________   LABS (all labs ordered are listed, but only abnormal results are displayed)  Labs Reviewed  COMPREHENSIVE METABOLIC PANEL - Abnormal; Notable for the following:    Creatinine, Ser 1.19 (*)    Calcium 8.5 (*)    Total Protein 6.2 (*)    Albumin 3.2 (*)    ALT 13 (*)    GFR calc non Af Amer 38 (*)    GFR calc Af Amer 45 (*)    Anion gap 3 (*)    All other components within normal limits  CBC WITH DIFFERENTIAL/PLATELET - Abnormal; Notable for the following:    RBC 2.93 (*)    Hemoglobin 10.2 (*)    HCT 30.7 (*)    MCV 104.8 (*)    MCH 34.9 (*)    RDW 14.7 (*)    Monocytes Absolute 1.5 (*)    All other components within normal limits  URINALYSIS COMPLETEWITH MICROSCOPIC ([facility] ONLY) - Abnormal; Notable for the following:    Color, Urine STRAW (*)    APPearance HAZY (*)    Leukocytes, UA 3+ (*)    All other components within normal limits  URINE CULTURE  LIPASE, BLOOD  TROPONIN I  LACTIC ACID, PLASMA  TROPONIN I   ____________________________________________  EKG  EKG shows normal sinus rhythm at 89 normal axis computer is reading short PR interval is 1 PVC and there are no acute ST-T wave changes ____________________________________________  RADIOLOGY  Chest x-ray read by radiologist as no acute disease   PROCEDURES  The patient continued to be very argumentative grabbing my hand grabbing the nurse's hand refusing to let us work on her. Patient was therefore given 1 mg of Ativan by mouth and 0.5 mg of Haldol by mouth. After half an hour patient  exhibited no relief of her combativeness. 10 more minutes were given and the nothing changed therefore patient was given another dose of 1 mg of Ativan by mouth and 5 mg of Haldol by mouth. At 520 we were able to work on the patient to get blood work EKG etc.     ____________________________________________   INITIAL IMPRESSION / ASSESSMENT AND PLAN / ED COURSE  Pertinent labs & imaging results that were available during my care of the patient were reviewed by me and considered in my medical decision making (see chart for details). Second troponin is negative patient has a UTI. Patient's niece reports she usually gets very aggressive when she gets UTIs this is probably the etiology of her aggressiveness we will treat her with Cipro since she is allergic to penicillin and cephalosporins and sulfa. I have included in my discharge instructions need to follow the INR carefully and adjust the Coumadin level while she is on the Cipro. We will also call and let them know  before she goes home.  ____________________________________________   FINAL CLINICAL IMPRESSION(S) / ED DIAGNOSES  Final diagnoses:  Urinary tract infection without hematuria, site unspecified         Nena Polio, MD 03/25/15 0007

## 2015-03-24 NOTE — ED Notes (Signed)
Report received from [facility]. Patient care assumed. Patient/RN introduction complete. Will continue to monitor.

## 2015-03-24 NOTE — ED Notes (Signed)
Per EMS, patient is from [facility]. Today caregivers noticed an altered mental status. Patient began cussing out residents and staff members. Patient also was refusing to take medications. Currently patient is confused. Disoriented to time. Patient states, "It's December" when asked orientation questions. Patient is verbally aggressive, "Now you don't tell me to hold on. Im as sick as a dog".

## 2015-03-25 NOTE — ED Notes (Signed)
Pt discharged back to [facility] via EMS report called to [place] at facility.

## 2015-03-26 ENCOUNTER — Emergency Department: Payer: Medicare PPO

## 2015-03-26 ENCOUNTER — Emergency Department
Admission: EM | Admit: 2015-03-26 | Discharge: 2015-03-27 | Disposition: A | Discharge status: Home / Self Care | Payer: Medicare PPO | Attending: Emergency Medicine | Admitting: Emergency Medicine

## 2015-03-26 ENCOUNTER — Encounter: Payer: Self-pay | Admitting: Emergency Medicine

## 2015-03-26 DIAGNOSIS — Z79899 Other long term (current) drug therapy: Secondary | ICD-10-CM | POA: Insufficient documentation

## 2015-03-26 DIAGNOSIS — I10 Essential (primary) hypertension: Secondary | ICD-10-CM | POA: Insufficient documentation

## 2015-03-26 DIAGNOSIS — Z880 Allergy status to penicillin: Secondary | ICD-10-CM | POA: Diagnosis not present

## 2015-03-26 DIAGNOSIS — W19XXXA Unspecified fall, initial encounter: Secondary | ICD-10-CM

## 2015-03-26 DIAGNOSIS — Y998 Other external cause status: Secondary | ICD-10-CM | POA: Diagnosis not present

## 2015-03-26 DIAGNOSIS — Z7951 Long term (current) use of inhaled steroids: Secondary | ICD-10-CM | POA: Diagnosis not present

## 2015-03-26 DIAGNOSIS — S20222A Contusion of left back wall of thorax, initial encounter: Secondary | ICD-10-CM | POA: Insufficient documentation

## 2015-03-26 DIAGNOSIS — S0990XA Unspecified injury of head, initial encounter: Secondary | ICD-10-CM | POA: Insufficient documentation

## 2015-03-26 DIAGNOSIS — S299XXA Unspecified injury of thorax, initial encounter: Secondary | ICD-10-CM | POA: Diagnosis present

## 2015-03-26 DIAGNOSIS — Z7901 Long term (current) use of anticoagulants: Secondary | ICD-10-CM | POA: Insufficient documentation

## 2015-03-26 DIAGNOSIS — Y9389 Activity, other specified: Secondary | ICD-10-CM | POA: Diagnosis not present

## 2015-03-26 DIAGNOSIS — Z792 Long term (current) use of antibiotics: Secondary | ICD-10-CM | POA: Insufficient documentation

## 2015-03-26 DIAGNOSIS — W1839XA Other fall on same level, initial encounter: Secondary | ICD-10-CM | POA: Diagnosis not present

## 2015-03-26 DIAGNOSIS — Y92129 Unspecified place in nursing home as the place of occurrence of the external cause: Secondary | ICD-10-CM | POA: Insufficient documentation

## 2015-03-26 NOTE — ED Notes (Signed)
EMS states that pt is from [facility] facility and fell today while walking to bathroom. Staff states that pt lost her balance and fell backwards and landed on back. Pt complains of pain to middle of back more to left side. EMS states that pt is on Coumadin.

## 2015-03-26 NOTE — ED Provider Notes (Signed)
[facility] Emergency Department Provider Note  ____________________________________________  Time seen: Approximately [id number] PM  I have reviewed the triage vital signs and the nursing notes.   HISTORY  Chief Complaint Fall    HPI Janet Smith is a 81 y.o. female with a history of A. fib on Coumadin as well as dementia who is presenting tonight after a fall from her skilled nursing facility. The patient does not recall the events but the ambulance signout was in the patient lost her balance" and fell backward. The patient denies any loss of consciousness. She denies any pain except for a mild pain to her left thoracic spine just medial to her left scapula. The patient says that she has no pain in her hips. The patient takes Coumadin at home.The patient's niece is at her bedside and says that her mental status is much improved from the last time she was in the hospital here with a urinary tract infection.   Past Medical History  Diagnosis Date  . Hypertension   . A-fib ([place])   . GERD (gastroesophageal reflux disease)   . Chronic airway obstruction ([place])   . Dysphagia   . CVA (cerebral infarction)   . Cancer ([place])     colon hx  . Macular degeneration   . Depression   . Dementia     There are no active problems to display for this patient.   History reviewed. No pertinent past surgical history.  Current Outpatient Rx  Name  Route  Sig  Dispense  Refill  . acetaminophen (TYLENOL) 325 MG tablet   Oral   Take 650 mg by mouth every 4 (four) hours as needed for mild pain or fever.         Marland Kitchen amLODipine (NORVASC) 10 MG tablet   Oral   Take 10 mg by mouth daily.         . bisacodyl (DULCOLAX) 10 MG suppository   Rectal   Place 10 mg rectally as needed for moderate constipation.         . ciprofloxacin (CIPRO) 500 MG tablet   Oral   Take 1 tablet (500 mg total) by mouth 2 (two) times daily.   9 tablet   0   . citalopram (CELEXA) 10 MG  tablet   Oral   Take 5 mg by mouth at bedtime.         . Fluticasone-Salmeterol (ADVAIR) 100-50 MCG/DOSE AEPB   Inhalation   Inhale 1 puff into the lungs 2 (two) times daily.         Marland Kitchen levalbuterol (XOPENEX) 1.25 MG/3ML nebulizer solution   Nebulization   Take 1.25 mg by nebulization every 4 (four) hours as needed for shortness of breath.         . loperamide (IMODIUM) 2 MG capsule   Oral   Take 2-4 mg by mouth as needed for diarrhea or loose stools.         . magnesium oxide (MAG-OX) 400 MG tablet   Oral   Take 400 mg by mouth daily.         . memantine (NAMENDA) 5 MG tablet   Oral   Take 5 mg by mouth 2 (two) times daily.         . metoprolol tartrate (LOPRESSOR) 25 MG tablet   Oral   Take 25 mg by mouth 2 (two) times daily.         . mometasone (NASONEX) 50 MCG/ACT nasal spray  Nasal   Place 2 sprays into the nose daily.         . Multiple Vitamin (THEREMS) TABS   Oral   Take 1 tablet by mouth daily.         . Multiple Vitamins-Minerals (PRESERVISION AREDS) TABS   Oral   Take 1 tablet by mouth daily.         . nizatidine (AXID) 150 MG capsule   Oral   Take 150 mg by mouth daily.         Marland Kitchen nystatin (MYCOSTATIN) powder   Topical   Apply 1 g topically 2 (two) times daily as needed (for rash).         Marland Kitchen omeprazole (PRILOSEC) 20 MG capsule   Oral   Take 20 mg by mouth 2 (two) times daily.         . polycarbophil (FIBERCON) 625 MG tablet   Oral   Take 1,250 mg by mouth daily.         . potassium chloride SA (K-DUR,KLOR-CON) 20 MEQ tablet   Oral   Take 20 mEq by mouth 2 (two) times daily.         . rivastigmine (EXELON) 9.5 mg/24hr   Transdermal   Place 9.5 mg onto the skin every evening.         . traMADol (ULTRAM) 50 MG tablet   Oral   Take 50 mg by mouth every 6 (six) hours as needed for moderate pain.         . vitamin B-12 (CYANOCOBALAMIN) 1000 MCG tablet   Oral   Take 1,000 mcg by mouth daily.         Marland Kitchen  warfarin (COUMADIN) 2 MG tablet   Oral   Take 2 mg by mouth daily. Pt takes on Wednesday and Saturday.         . warfarin (COUMADIN) 3 MG tablet   Oral   Take 3 mg by mouth daily. Pt takes on Monday, Tuesday, Thursday, Friday, and Sunday.           Allergies Codeine; Eggs or egg-derived products; Fish allergy; Influenza vaccines; Penicillins; and Sulfa antibiotics  History reviewed. No pertinent family history.  Social History Social History  Substance Use Topics  . Smoking status: Unknown If Ever Smoked  . Smokeless tobacco: None  . Alcohol Use: None    Review of Systems Constitutional: No fever/chills Eyes: No visual changes. ENT: No sore throat. Cardiovascular: Denies chest pain. Respiratory: Denies shortness of breath. Gastrointestinal: No abdominal pain.  No nausea, no vomiting.  No diarrhea.  No constipation. Genitourinary: Negative for dysuria. Musculoskeletal: Negative for back pain. Skin: Negative for rash. Neurological: Negative for headaches, focal weakness or numbness.  10-point ROS otherwise negative.  ____________________________________________   PHYSICAL EXAM:  VITAL SIGNS: ED Triage Vitals  Enc Vitals Group     BP 03/26/15 2232 125/69 mmHg     Pulse Rate 03/26/15 2232 73     Resp 03/26/15 2232 21     Temp 03/26/15 2232 98.6 F (37 C)     Temp Source 03/26/15 2232 Oral     SpO2 03/26/15 2232 93 %     Weight 03/26/15 2232 117 lb 11.6 oz (53.4 kg)     Height 03/26/15 2232 5\' 3"  (1.6 m)     Head Cir --      Peak Flow --      Pain Score --      Pain Loc --  Pain Edu? --      Excl. in [place]? --     Constitutional: Alert and oriented. Well appearing and in no acute distress. Eyes: Conjunctivae are normal. PERRL. EOMI. Head: Atraumatic. Nose: No congestion/rhinnorhea. Mouth/Throat: Mucous membranes are moist.  Oropharynx non-erythematous. Neck: No stridor.  No tenderness, step off deformity or ecchymosis to the C-spine. Cardiovascular:  Normal rate, regular rhythm. Grossly normal heart sounds.  Good peripheral circulation. Respiratory: Normal respiratory effort.  No retractions. Lungs CTAB. Gastrointestinal: Soft and nontender. No distention. No abdominal bruits. No CVA tenderness. Musculoskeletal: No lower extremity tenderness nor edema.  No joint effusions. The hips were ranged fully and smoothly without any pain or resistance. There is no deformity or rotation to the pelvis or lower extremities. There is no tenderness, ecchymosis or crepitus to the thorax. Neurologic:  Normal speech and language. No gross focal neurologic deficits are appreciated. No gait instability. Skin:  Skin is warm, dry and intact. No rash noted. Psychiatric: Mood and affect are normal. Speech and behavior are normal.  ____________________________________________   LABS (all labs ordered are listed, but only abnormal results are displayed)  Labs Reviewed  PROTIME-INR - Abnormal; Notable for the following:    Prothrombin Time 26.3 (*)    All other components within normal limits   ____________________________________________  EKG   ____________________________________________  RADIOLOGY  CT of the brain without evidence of traumatic injury or fracture. Chest x-ray without any acute findings. ____________________________________________   PROCEDURES   ____________________________________________   INITIAL IMPRESSION / ASSESSMENT AND PLAN / ED COURSE  Pertinent labs & imaging results that were available during my care of the patient were reviewed by me and considered in my medical decision making (see chart for details).  ----------------------------------------- 1:17 AM on 03/27/2015 -----------------------------------------  Patient resting comfortably at this time and noted to have a mechanical fall. We'll discharge to home. Reassuring INR as well as imaging and exam. ____________________________________________   FINAL  CLINICAL IMPRESSION(S) / ED DIAGNOSES  Acute fall. Acute thoracic contusion.    Orbie Pyo, MD 03/27/15 [phone]

## 2015-03-27 DIAGNOSIS — S20222A Contusion of left back wall of thorax, initial encounter: Secondary | ICD-10-CM | POA: Diagnosis not present

## 2015-03-27 LAB — PROTIME-INR
INR: 2.45
Prothrombin Time: 26.3 seconds — ABNORMAL HIGH (ref 11.4–15.0)

## 2015-03-27 NOTE — ED Notes (Signed)
Family reports they will be headed home at this time and have been updated on pts status. Pts niece left phone number and asked to be updated if any changes are made in pt care. Grace Blight [phone])

## 2015-03-27 NOTE — Discharge Instructions (Signed)

## 2015-03-28 LAB — URINE CULTURE

## 2015-03-30 NOTE — Progress Notes (Signed)
Pharmacy Note - Antibiotic Follow Up  Patient seen in ED 03/24/15 and diagnosed with UTI. Given prescription for ciprofloxacin.  Recent Results (from the past 240 hour(s))  Urine culture     Status: None   Collection Time: 03/24/15  5:14 PM  Result Value Ref Range Status   Specimen Description URINE, CATHETERIZED  Final   Special Requests NONE  Final   Culture >=100,000 COLONIES/mL PROTEUS MIRABILIS  Final   Report Status 03/28/2015 FINAL  Final   Organism ID, Bacteria PROTEUS MIRABILIS  Final      Susceptibility   Proteus mirabilis - MIC*    AMPICILLIN Value in next row Sensitive      SENSITIVE<=2    CEFAZOLIN Value in next row Sensitive      SENSITIVE<=4    CEFTRIAXONE Value in next row Sensitive      SENSITIVE<=1    CIPROFLOXACIN Value in next row Intermediate      INTERMEDIATE2    GENTAMICIN Value in next row Sensitive      SENSITIVE<=1    IMIPENEM Value in next row Sensitive      SENSITIVE2    NITROFURANTOIN Value in next row Resistant      RESISTANT128    TRIMETH/SULFA Value in next row Sensitive      SENSITIVE<=20    PIP/TAZO Value in next row Sensitive      SENSITIVE<=4    LEVOFLOXACIN Value in next row Intermediate      INTERMEDIATE4    * >=100,000 COLONIES/mL PROTEUS MIRABILIS    Culture results intermediate sensitivity to cipro. Patient with allergy to penicillin and sulfa antibiotics. Discussed with Dr. Reita Cliche in ED. MD recommends patient follow up with PCP would like culture/sensitivity info sent. Faxed information and culture report to Dr. Maryland Pink at [facility] of Jessica Priest, PharmD Clinical Pharmacist  03/30/2015 3:39 PM

## 2015-08-01 ENCOUNTER — Emergency Department: Payer: Medicare Other

## 2015-08-01 ENCOUNTER — Inpatient Hospital Stay
Admission: EM | Admit: 2015-08-01 | Discharge: 2015-08-06 | DRG: 190 | Disposition: A | Discharge status: Skilled Nursing Facility | Payer: Medicare Other | Attending: Internal Medicine | Admitting: Internal Medicine

## 2015-08-01 DIAGNOSIS — E876 Hypokalemia: Secondary | ICD-10-CM | POA: Diagnosis present

## 2015-08-01 DIAGNOSIS — E440 Moderate protein-calorie malnutrition: Secondary | ICD-10-CM | POA: Diagnosis present

## 2015-08-01 DIAGNOSIS — R1319 Other dysphagia: Secondary | ICD-10-CM | POA: Diagnosis present

## 2015-08-01 DIAGNOSIS — F0390 Unspecified dementia without behavioral disturbance: Secondary | ICD-10-CM | POA: Diagnosis present

## 2015-08-01 DIAGNOSIS — Z7901 Long term (current) use of anticoagulants: Secondary | ICD-10-CM

## 2015-08-01 DIAGNOSIS — J811 Chronic pulmonary edema: Secondary | ICD-10-CM | POA: Diagnosis present

## 2015-08-01 DIAGNOSIS — R1310 Dysphagia, unspecified: Secondary | ICD-10-CM | POA: Diagnosis present

## 2015-08-01 DIAGNOSIS — F0281 Dementia in other diseases classified elsewhere with behavioral disturbance: Secondary | ICD-10-CM | POA: Diagnosis present

## 2015-08-01 DIAGNOSIS — Z888 Allergy status to other drugs, medicaments and biological substances status: Secondary | ICD-10-CM | POA: Diagnosis not present

## 2015-08-01 DIAGNOSIS — I482 Chronic atrial fibrillation: Secondary | ICD-10-CM | POA: Diagnosis present

## 2015-08-01 DIAGNOSIS — I248 Other forms of acute ischemic heart disease: Secondary | ICD-10-CM | POA: Diagnosis present

## 2015-08-01 DIAGNOSIS — L8995 Pressure ulcer of unspecified site, unstageable: Secondary | ICD-10-CM | POA: Diagnosis present

## 2015-08-01 DIAGNOSIS — Z79899 Other long term (current) drug therapy: Secondary | ICD-10-CM | POA: Diagnosis not present

## 2015-08-01 DIAGNOSIS — L89891 Pressure ulcer of other site, stage 1: Secondary | ICD-10-CM | POA: Diagnosis present

## 2015-08-01 DIAGNOSIS — Z882 Allergy status to sulfonamides status: Secondary | ICD-10-CM | POA: Diagnosis not present

## 2015-08-01 DIAGNOSIS — J9621 Acute and chronic respiratory failure with hypoxia: Secondary | ICD-10-CM | POA: Diagnosis present

## 2015-08-01 DIAGNOSIS — J449 Chronic obstructive pulmonary disease, unspecified: Secondary | ICD-10-CM | POA: Diagnosis present

## 2015-08-01 DIAGNOSIS — J441 Chronic obstructive pulmonary disease with (acute) exacerbation: Principal | ICD-10-CM | POA: Diagnosis present

## 2015-08-01 DIAGNOSIS — F329 Major depressive disorder, single episode, unspecified: Secondary | ICD-10-CM | POA: Diagnosis present

## 2015-08-01 DIAGNOSIS — E860 Dehydration: Secondary | ICD-10-CM | POA: Diagnosis present

## 2015-08-01 DIAGNOSIS — I129 Hypertensive chronic kidney disease with stage 1 through stage 4 chronic kidney disease, or unspecified chronic kidney disease: Secondary | ICD-10-CM | POA: Diagnosis present

## 2015-08-01 DIAGNOSIS — T502X5A Adverse effect of carbonic-anhydrase inhibitors, benzothiadiazides and other diuretics, initial encounter: Secondary | ICD-10-CM | POA: Diagnosis present

## 2015-08-01 DIAGNOSIS — Z8673 Personal history of transient ischemic attack (TIA), and cerebral infarction without residual deficits: Secondary | ICD-10-CM

## 2015-08-01 DIAGNOSIS — L8990 Pressure ulcer of unspecified site, unspecified stage: Secondary | ICD-10-CM | POA: Insufficient documentation

## 2015-08-01 DIAGNOSIS — G309 Alzheimer's disease, unspecified: Secondary | ICD-10-CM | POA: Diagnosis present

## 2015-08-01 DIAGNOSIS — I4891 Unspecified atrial fibrillation: Secondary | ICD-10-CM | POA: Diagnosis present

## 2015-08-01 DIAGNOSIS — N183 Chronic kidney disease, stage 3 (moderate): Secondary | ICD-10-CM | POA: Diagnosis present

## 2015-08-01 DIAGNOSIS — Z887 Allergy status to serum and vaccine status: Secondary | ICD-10-CM | POA: Diagnosis not present

## 2015-08-01 DIAGNOSIS — H3530 Unspecified macular degeneration: Secondary | ICD-10-CM | POA: Diagnosis present

## 2015-08-01 DIAGNOSIS — K219 Gastro-esophageal reflux disease without esophagitis: Secondary | ICD-10-CM | POA: Diagnosis present

## 2015-08-01 DIAGNOSIS — Z885 Allergy status to narcotic agent status: Secondary | ICD-10-CM

## 2015-08-01 DIAGNOSIS — Z880 Allergy status to penicillin: Secondary | ICD-10-CM

## 2015-08-01 DIAGNOSIS — Z91012 Allergy to eggs: Secondary | ICD-10-CM

## 2015-08-01 DIAGNOSIS — Z66 Do not resuscitate: Secondary | ICD-10-CM | POA: Diagnosis present

## 2015-08-01 DIAGNOSIS — Z85038 Personal history of other malignant neoplasm of large intestine: Secondary | ICD-10-CM

## 2015-08-01 DIAGNOSIS — Z681 Body mass index (BMI) 19 or less, adult: Secondary | ICD-10-CM

## 2015-08-01 DIAGNOSIS — R0602 Shortness of breath: Secondary | ICD-10-CM

## 2015-08-01 DIAGNOSIS — N170 Acute kidney failure with tubular necrosis: Secondary | ICD-10-CM | POA: Diagnosis present

## 2015-08-01 DIAGNOSIS — E872 Acidosis: Secondary | ICD-10-CM | POA: Diagnosis present

## 2015-08-01 DIAGNOSIS — G301 Alzheimer's disease with late onset: Secondary | ICD-10-CM | POA: Diagnosis not present

## 2015-08-01 DIAGNOSIS — Z515 Encounter for palliative care: Secondary | ICD-10-CM | POA: Diagnosis not present

## 2015-08-01 LAB — COMPREHENSIVE METABOLIC PANEL
ALK PHOS: 77 U/L (ref 38–126)
ALT: 25 U/L (ref 14–54)
AST: 58 U/L — AB (ref 15–41)
Albumin: 3.3 g/dL — ABNORMAL LOW (ref 3.5–5.0)
Anion gap: 12 (ref 5–15)
BUN: 31 mg/dL — AB (ref 6–20)
CALCIUM: 8.6 mg/dL — AB (ref 8.9–10.3)
CO2: 26 mmol/L (ref 22–32)
CREATININE: 1.34 mg/dL — AB (ref 0.44–1.00)
Chloride: 100 mmol/L — ABNORMAL LOW (ref 101–111)
GFR, EST AFRICAN AMERICAN: 39 mL/min — AB (ref >60)
GFR, EST NON AFRICAN AMERICAN: 33 mL/min — AB (ref >60)
Glucose, Bld: 187 mg/dL — ABNORMAL HIGH (ref 65–99)
Potassium: 3.9 mmol/L (ref 3.5–5.1)
SODIUM: 138 mmol/L (ref 135–145)
Total Bilirubin: 0.8 mg/dL (ref 0.3–1.2)
Total Protein: 7.1 g/dL (ref 6.5–8.1)

## 2015-08-01 LAB — CBC
HCT: 34.8 % — ABNORMAL LOW (ref 35.0–47.0)
HEMOGLOBIN: 12.1 g/dL (ref 12.0–16.0)
MCH: 35.9 pg — AB (ref 26.0–34.0)
MCHC: 34.8 g/dL (ref 32.0–36.0)
MCV: 103 fL — AB (ref 80.0–100.0)
Platelets: 427 10*3/uL (ref 150–440)
RBC: 3.38 MIL/uL — AB (ref 3.80–5.20)
RDW: 15.4 % — ABNORMAL HIGH (ref 11.5–14.5)
WBC: 10.3 10*3/uL (ref 3.6–11.0)

## 2015-08-01 LAB — TROPONIN I
TROPONIN I: 0.03 ng/mL (ref <0.031)
Troponin I: 0.05 ng/mL — ABNORMAL HIGH (ref <0.031)
Troponin I: 0.03 ng/mL (ref <0.031)

## 2015-08-01 LAB — PROTIME-INR
INR: 1.7
PROTHROMBIN TIME: 20 s — AB (ref 11.4–15.0)

## 2015-08-01 MED ORDER — ONDANSETRON HCL 4 MG PO TABS: 4.0000 mg | ORAL_TABLET | Freq: Four times a day (QID) | ORAL | Status: DC | PRN

## 2015-08-01 MED ORDER — RIVASTIGMINE 9.5 MG/24HR TD PT24
9.5000 mg | MEDICATED_PATCH | Freq: Every evening | TRANSDERMAL | Status: DC
  Administered 2015-08-01 – 2015-08-05 (×5): 9.5 mg via TRANSDERMAL
  Filled 2015-08-01 (×7): qty 1

## 2015-08-01 MED ORDER — POTASSIUM CHLORIDE CRYS ER 20 MEQ PO TBCR
20.0000 meq | EXTENDED_RELEASE_TABLET | Freq: Two times a day (BID) | ORAL | Status: DC
  Administered 2015-08-01 – 2015-08-06 (×9): 20 meq via ORAL
  Filled 2015-08-01 (×11): qty 1

## 2015-08-01 MED ORDER — ADULT MULTIVITAMIN W/MINERALS CH
1.0000 | ORAL_TABLET | Freq: Every day | ORAL | Status: DC
  Administered 2015-08-02 – 2015-08-06 (×5): 1 via ORAL
  Filled 2015-08-01 (×6): qty 1

## 2015-08-01 MED ORDER — ONDANSETRON HCL 4 MG/2ML IJ SOLN
4.0000 mg | Freq: Once | INTRAMUSCULAR | Status: AC
  Administered 2015-08-01: 4 mg via INTRAVENOUS
  Filled 2015-08-01: qty 2

## 2015-08-01 MED ORDER — LORAZEPAM 0.5 MG PO TABS
0.5000 mg | ORAL_TABLET | Freq: Four times a day (QID) | ORAL | Status: DC | PRN
  Administered 2015-08-03: 0.5 mg via ORAL
  Filled 2015-08-01: qty 1

## 2015-08-01 MED ORDER — SODIUM CHLORIDE 0.9 % IV SOLN
INTRAVENOUS | Status: DC
  Administered 2015-08-01 – 2015-08-03 (×3): via INTRAVENOUS

## 2015-08-01 MED ORDER — TRAMADOL HCL 50 MG PO TABS: 50.0000 mg | ORAL_TABLET | Freq: Four times a day (QID) | ORAL | Status: DC | PRN

## 2015-08-01 MED ORDER — IPRATROPIUM-ALBUTEROL 0.5-2.5 (3) MG/3ML IN SOLN
3.0000 mL | RESPIRATORY_TRACT | Status: DC
  Administered 2015-08-01 (×2): 3 mL via RESPIRATORY_TRACT
  Filled 2015-08-01 (×2): qty 3

## 2015-08-01 MED ORDER — METHYLPREDNISOLONE SODIUM SUCC 125 MG IJ SOLR
125.0000 mg | Freq: Once | INTRAMUSCULAR | Status: AC
  Administered 2015-08-01: 125 mg via INTRAVENOUS
  Filled 2015-08-01: qty 2

## 2015-08-01 MED ORDER — ALUM & MAG HYDROXIDE-SIMETH 200-200-20 MG/5ML PO SUSP: 30.0000 mL | Freq: Four times a day (QID) | ORAL | Status: DC | PRN

## 2015-08-01 MED ORDER — LEVOFLOXACIN IN D5W 750 MG/150ML IV SOLN: 750.0000 mg | INTRAVENOUS | Status: DC

## 2015-08-01 MED ORDER — SENNOSIDES-DOCUSATE SODIUM 8.6-50 MG PO TABS: 1.0000 | ORAL_TABLET | Freq: Every evening | ORAL | Status: DC | PRN

## 2015-08-01 MED ORDER — IPRATROPIUM-ALBUTEROL 0.5-2.5 (3) MG/3ML IN SOLN
3.0000 mL | Freq: Once | RESPIRATORY_TRACT | Status: AC
  Administered 2015-08-01: 3 mL via RESPIRATORY_TRACT
  Filled 2015-08-01: qty 3

## 2015-08-01 MED ORDER — ACETAMINOPHEN 650 MG RE SUPP: 650.0000 mg | Freq: Four times a day (QID) | RECTAL | Status: DC | PRN

## 2015-08-01 MED ORDER — PNEUMOCOCCAL VAC POLYVALENT 25 MCG/0.5ML IJ INJ: 0.5000 mL | INJECTION | INTRAMUSCULAR | Status: DC

## 2015-08-01 MED ORDER — ALBUTEROL SULFATE (2.5 MG/3ML) 0.083% IN NEBU
2.5000 mg | INHALATION_SOLUTION | Freq: Four times a day (QID) | RESPIRATORY_TRACT | Status: DC
  Administered 2015-08-01 – 2015-08-02 (×3): 2.5 mg via RESPIRATORY_TRACT
  Filled 2015-08-01 (×2): qty 3

## 2015-08-01 MED ORDER — MEMANTINE HCL 5 MG PO TABS
5.0000 mg | ORAL_TABLET | Freq: Two times a day (BID) | ORAL | Status: DC
  Administered 2015-08-01 – 2015-08-06 (×9): 5 mg via ORAL
  Filled 2015-08-01 (×11): qty 1

## 2015-08-01 MED ORDER — PROSIGHT PO TABS
1.0000 | ORAL_TABLET | Freq: Every day | ORAL | Status: DC
  Administered 2015-08-02 – 2015-08-06 (×5): 1 via ORAL
  Filled 2015-08-01 (×10): qty 1

## 2015-08-01 MED ORDER — ONDANSETRON HCL 4 MG/2ML IJ SOLN: 4.0000 mg | Freq: Four times a day (QID) | INTRAMUSCULAR | Status: DC | PRN

## 2015-08-01 MED ORDER — LEVOFLOXACIN IN D5W 750 MG/150ML IV SOLN
750.0000 mg | INTRAVENOUS | Status: DC
  Administered 2015-08-03 – 2015-08-05 (×2): 750 mg via INTRAVENOUS
  Filled 2015-08-01 (×2): qty 150

## 2015-08-01 MED ORDER — METHYLPREDNISOLONE SODIUM SUCC 125 MG IJ SOLR
60.0000 mg | Freq: Three times a day (TID) | INTRAMUSCULAR | Status: DC
  Administered 2015-08-01 – 2015-08-05 (×9): 60 mg via INTRAVENOUS
  Filled 2015-08-01 (×11): qty 2

## 2015-08-01 MED ORDER — AMLODIPINE BESYLATE 10 MG PO TABS
10.0000 mg | ORAL_TABLET | Freq: Every day | ORAL | Status: DC
  Administered 2015-08-02 – 2015-08-06 (×5): 10 mg via ORAL
  Filled 2015-08-01 (×6): qty 1

## 2015-08-01 MED ORDER — PANTOPRAZOLE SODIUM 40 MG PO TBEC
40.0000 mg | DELAYED_RELEASE_TABLET | Freq: Every day | ORAL | Status: DC
  Administered 2015-08-02 – 2015-08-06 (×5): 40 mg via ORAL
  Filled 2015-08-01 (×7): qty 1

## 2015-08-01 MED ORDER — METOPROLOL TARTRATE 25 MG PO TABS
25.0000 mg | ORAL_TABLET | Freq: Two times a day (BID) | ORAL | Status: DC
  Administered 2015-08-01 – 2015-08-05 (×7): 25 mg via ORAL
  Filled 2015-08-01 (×9): qty 1

## 2015-08-01 MED ORDER — APIXABAN 5 MG PO TABS
2.5000 mg | ORAL_TABLET | Freq: Two times a day (BID) | ORAL | Status: DC
  Administered 2015-08-01 – 2015-08-05 (×8): 2.5 mg via ORAL
  Administered 2015-08-06: 5 mg via ORAL
  Filled 2015-08-01 (×10): qty 1
  Filled 2015-08-01: qty 2

## 2015-08-01 MED ORDER — MOMETASONE FURO-FORMOTEROL FUM 100-5 MCG/ACT IN AERO
2.0000 | INHALATION_SPRAY | Freq: Two times a day (BID) | RESPIRATORY_TRACT | Status: DC
  Administered 2015-08-01 – 2015-08-06 (×9): 2 via RESPIRATORY_TRACT
  Filled 2015-08-01: qty 8.8

## 2015-08-01 MED ORDER — CITALOPRAM HYDROBROMIDE 10 MG PO TABS
5.0000 mg | ORAL_TABLET | Freq: Every day | ORAL | Status: DC
  Administered 2015-08-01 – 2015-08-05 (×4): 5 mg via ORAL
  Filled 2015-08-01 (×6): qty 1

## 2015-08-01 MED ORDER — ACETAMINOPHEN 325 MG PO TABS
650.0000 mg | ORAL_TABLET | Freq: Four times a day (QID) | ORAL | Status: DC | PRN
  Administered 2015-08-04: 22:00:00 650 mg via ORAL
  Filled 2015-08-01: qty 2

## 2015-08-01 MED ORDER — VITAMIN B-12 1000 MCG PO TABS
1000.0000 ug | ORAL_TABLET | Freq: Every day | ORAL | Status: DC
  Administered 2015-08-02 – 2015-08-06 (×5): 1000 ug via ORAL
  Filled 2015-08-01 (×6): qty 1

## 2015-08-01 MED ORDER — SODIUM CHLORIDE 0.9% FLUSH
3.0000 mL | Freq: Two times a day (BID) | INTRAVENOUS | Status: DC
  Administered 2015-08-01 – 2015-08-06 (×7): 3 mL via INTRAVENOUS

## 2015-08-01 MED ORDER — CALCIUM POLYCARBOPHIL 625 MG PO TABS
1250.0000 mg | ORAL_TABLET | Freq: Every day | ORAL | Status: DC
  Administered 2015-08-02 – 2015-08-06 (×5): 1250 mg via ORAL
  Filled 2015-08-01 (×5): qty 2

## 2015-08-01 NOTE — ED Notes (Signed)
Pt alert, oriented x1. Pt satting 94% on 4L [place]. Respirations even and unlabored.

## 2015-08-01 NOTE — Progress Notes (Signed)
Pharmacy Antibiotic Note  Janet Smith is a 82 y.o. female admitted on 08/01/2015 with COPD.  Patient was taking Levaquin 500mg  PO daily prior to admission and received a dose this morning. MD has ordered Levaquin 750mg  IV q24h.  Plan: The dose of Levaquin will be adjusted to 750mg  IV q48h based on renal function. Timed first dose for 3/21 since patient received a dose this morning.  Height: 5\' 5"  (165.1 cm) Weight: 104 lb 8 oz (47.401 kg) IBW/kg (Calculated) : 57  Temp (24hrs), Avg:97.3 F (36.3 C), Min:97.3 F (36.3 C), Max:97.3 F (36.3 C)   Recent Labs Lab 08/01/15 1433  WBC 10.3  CREATININE 1.34*    Estimated Creatinine Clearance: 20 mL/min (by C-G formula based on Cr of 1.34).    Allergies  Allergen Reactions  . Cephalosporins   . Codeine Other (See Comments)    Reaction:  Unknown   . Eggs Or Egg-Derived Products Other (See Comments)    Reaction:  Unknown   . Fish Allergy Other (See Comments)    Reaction:  Unknown   . Influenza Vaccines Other (See Comments)    Reaction:  Unknown   . Penicillins Other (See Comments)    Reaction:  Unknown   . Sulfa Antibiotics Other (See Comments)    Reaction:  Unknown    Thank you for allowing pharmacy to be a part of this patient's care.  Paulina Fusi, PharmD, BCPS 08/01/2015 8:09 PM

## 2015-08-01 NOTE — ED Notes (Signed)
Pt taken off bipap and placed on 4L [place]

## 2015-08-01 NOTE — H&P (Signed)
[facility] at [place] NAME: Janet Smith    MR#:  [id number]  DATE OF BIRTH:  01-Jul-1920  DATE OF ADMISSION:  08/01/2015  PRIMARY CARE PHYSICIAN: Maryland Pink, MD   REQUESTING/REFERRING PHYSICIAN: Dr Kerman Passey  CHIEF COMPLAINT:  SOB  HISTORY OF PRESENT ILLNESS:   Janet Smith  is a 82 y.o. female from [facility] with a known history of Dementia, atrial fibrillation and chronic airway obstruction who presents with above issue. Patient had increasing shortness of breath this morning at [facility]. She normally wears 2 L oxygen and her O2 saturations were 70%. EMS was called and en route patient is given DuoNeb. She continued to have increasing shortness of breath and respiratory distress. When she arrived to the ER she was placed on BiPAP machine. She has been given 3 DuoNeb's and IV steroids for wheezing.  PMHx   Past Medical History  Diagnosis Date  . Hypertension   . A-fib ([place])   . GERD (gastroesophageal reflux disease)   . Chronic airway obstruction ([place])   . Dysphagia   . CVA (cerebral infarction)   . Cancer ([place])     colon hx  . Macular degeneration   . Depression   . Dementia     PAST SURGICAL HISTORY:  Cholecystectomy Appendectomy  SOCIAL HISTORY:   Social History  Substance Use Topics  . Smoking status: Unknown If Ever Smoked  . Smokeless tobacco: unknown  . Alcohol Use: No    FAMILY HISTORY:   Unknown and patient with dementia so unable to contribute  DRUG ALLERGIES:   Allergies  Allergen Reactions  . Cephalosporins   . Codeine Other (See Comments)    Reaction:  Unknown   . Eggs Or Egg-Derived Products Other (See Comments)    Reaction:  Unknown   . Fish Allergy Other (See Comments)    Reaction:  Unknown   . Influenza Vaccines Other (See Comments)    Reaction:  Unknown   . Penicillins Other (See Comments)    Reaction:  Unknown   . Sulfa Antibiotics Other (See Comments)     Reaction:  Unknown      REVIEW OF SYSTEMS:  Patient with dementia unable to contribute review of systems  MEDICATIONS AT HOME:   Prior to Admission medications   Medication Sig Start Date End Date Taking? Authorizing Provider  acetaminophen (TYLENOL) 325 MG tablet Take 650 mg by mouth every 4 (four) hours as needed for mild pain or fever.   Yes Historical Provider, MD  amLODipine (NORVASC) 10 MG tablet Take 10 mg by mouth daily.   Yes Historical Provider, MD  apixaban (ELIQUIS) 2.5 MG TABS tablet Take 2.5 mg by mouth 2 (two) times daily.   Yes Historical Provider, MD  citalopram (CELEXA) 10 MG tablet Take 5 mg by mouth at bedtime.   Yes Historical Provider, MD  Fluticasone-Salmeterol (ADVAIR) 100-50 MCG/DOSE AEPB Inhale 1 puff into the lungs 2 (two) times daily.   Yes Historical Provider, MD  levalbuterol (XOPENEX) 1.25 MG/3ML nebulizer solution Take 1.25 mg by nebulization every 4 (four) hours as needed for shortness of breath.   Yes Historical Provider, MD  levofloxacin (LEVAQUIN) 500 MG tablet Take 500 mg by mouth daily. 07/26/15 08/03/15 Yes Historical Provider, MD  loperamide (IMODIUM) 2 MG capsule Take 2-4 mg by mouth See admin instructions. Take 2 capsules (4mg ) by mouth for first dose, then take 1 tablet by mouth every 3 hours  as needed after loose stool. *No more than 8 capsules per 24 hours*   Yes Historical Provider, MD  LORazepam (ATIVAN) 0.5 MG tablet Take 0.5 mg by mouth every 6 (six) hours as needed for anxiety.   Yes Historical Provider, MD  magnesium oxide (MAG-OX) 400 MG tablet Take 400 mg by mouth daily.   Yes Historical Provider, MD  memantine (NAMENDA) 5 MG tablet Take 5 mg by mouth 2 (two) times daily.   Yes Historical Provider, MD  metoprolol tartrate (LOPRESSOR) 25 MG tablet Take 25 mg by mouth 2 (two) times daily.   Yes Historical Provider, MD  mometasone (NASONEX) 50 MCG/ACT nasal spray Place 2 sprays into the nose daily.   Yes Historical Provider, MD  Multiple  Vitamin (THEREMS) TABS Take 1 tablet by mouth daily.   Yes Historical Provider, MD  Multiple Vitamins-Minerals (PRESERVISION AREDS) TABS Take 1 tablet by mouth daily.   Yes Historical Provider, MD  nizatidine (AXID) 150 MG capsule Take 150 mg by mouth daily.   Yes Historical Provider, MD  omeprazole (PRILOSEC) 20 MG capsule Take 20 mg by mouth 2 (two) times daily.   Yes Historical Provider, MD  polycarbophil (FIBERCON) 625 MG tablet Take 1,250 mg by mouth daily.   Yes Historical Provider, MD  potassium chloride SA (K-DUR,KLOR-CON) 20 MEQ tablet Take 20 mEq by mouth 2 (two) times daily.   Yes Historical Provider, MD  rivastigmine (EXELON) 9.5 mg/24hr Place 9.5 mg onto the skin every evening.   Yes Historical Provider, MD  traMADol (ULTRAM) 50 MG tablet Take 50 mg by mouth every 6 (six) hours as needed for moderate pain.   Yes Historical Provider, MD  vitamin B-12 (CYANOCOBALAMIN) 1000 MCG tablet Take 1,000 mcg by mouth daily.   Yes Historical Provider, MD      VITAL SIGNS:  Blood pressure 118/70, pulse 65, temperature 97.3 F (36.3 C), temperature source Axillary, resp. rate 26, height 5\' 5"  (1.651 m), weight 53.071 kg (117 lb), SpO2 96 %.  PHYSICAL EXAMINATION:  GENERAL:  82 y.o.-year-old patient lying in the bed Initially with moderate acute distress using accessory muscles to breathe. She is now on BiPAP machine  EYES: Pupils equal, round, reactive to light and accommodation. No scleral icterus. Extraocular muscles intact.  HEENT: Head atraumatic, normocephalic. Patient with BiPAP   NECK:  Supple, no jugular venous distention. No thyroid enlargement, no tenderness.  LUNGS: Decreased presence throughout lung fields with bilateral wheezing no  rales,rhonchi or crepitation.She she had the  use of accessory muscles of respiration On admission to the ER but appears more comfortable now.  CARDIOVASCULAR: Irregular, irregular. No murmurs, rubs, or gallops.  ABDOMEN: Soft, nontender, nondistended.  Bowel sounds present. No organomegaly or mass.  EXTREMITIES: No pedal edema, cyanosis, or clubbing.  NEUROLOGIC: Cranial nerves II through XII are grossly intact. No focal deficits. PSYCHIATRIC: The patient is alert and oriented x 3.  SKIN: No obvious rash, lesion, or ulcer.   LABORATORY PANEL:   CBC  Recent Labs Lab 08/01/15 1433  WBC 10.3  HGB 12.1  HCT 34.8*  PLT 427   ------------------------------------------------------------------------------------------------------------------  Chemistries   Recent Labs Lab 08/01/15 1433  NA 138  K 3.9  CL 100*  CO2 26  GLUCOSE 187*  BUN 31*  CREATININE 1.34*  CALCIUM 8.6*  AST 58*  ALT 25  ALKPHOS 77  BILITOT 0.8   ------------------------------------------------------------------------------------------------------------------  Cardiac Enzymes  Recent Labs Lab 08/01/15 1433  TROPONINI 0.05*   ------------------------------------------------------------------------------------------------------------------  RADIOLOGY:  Dg  Chest Portable 1 View  08/01/2015  CLINICAL DATA:  Shortness of breath for 1 day EXAM: PORTABLE CHEST 1 VIEW COMPARISON:  March 25, 2017 FINDINGS: There is no edema or consolidation. The heart is upper normal in size with pulmonary vascularity within normal limits. Aorta is diffusely tortuous but stable. There is atherosclerotic calcification in the aortic arch region. No adenopathy is apparent. There is an acute appearing fracture of the anterior right seventh rib. No pneumothorax. IMPRESSION: Acute fracture anterior right seventh rib. No pneumothorax. No edema or consolidation. Cardiac silhouette stable allowing for portable technique. Aorta is diffusely tortuous. Electronically Signed   By: Lowella Grip III M.D.   On: 08/01/2015 15:44    EKG:   Atrial fibrillation heart rate 94  IMPRESSION AND PLAN:    82 year old female with a history of dementia, atrial fibrillation on  anticoagulation and COPD who presents with acute on chronic hypoxic respiratory failure.  1. Acute on chronic hypoxic respiratory failure due to COPD exacerbation: Continue BiPAP and wean as tolerated. Patient wears 2 L of oxygen at [facility]. Start IV steroids, DuoNeb's and inhalers. Levaquin for COPD exacerbation.   2. Atrial fibrillation: Heart rate is controlled. Continue Norvasc, apixaban, metoprolol.  3. Dementia: Continue Namenda and Exelon patch.  4. History of dysphagia: Speech consult and dysphagia 1 diet for now.  5. Elevated troponin: His is likely due to demand ischemia from problem #1. Trend troponins and continue telemetry monitoring.  All the records are reviewed and case discussed with ED provider. Marland Kitchen  CODE STATUS: *DNR  CRITICAL CARE TOTAL TIME TAKING CARE OF THIS PATIENT: 53 minutes.  HIGH RISK FOR RESP arrest  Nicholos Aloisi M.D on 08/01/2015 at 4:28 PM  Between 7am to 6pm - Pager - [phone] After 6pm go to [email or web address] - password [email or web address] [facility]  [facility]  Office  [phone]  CC: Primary care physician; Maryland Pink, MD

## 2015-08-01 NOTE — ED Notes (Signed)
Pt came from [facility] via EMS. Pt has had sob since this morning. Pt was treated for pneumonia and finished antibiotics yesterday. Pt satting 88% r/a per EMS. Pt has dementia, staff at [facility] report she is on and off alert and oriented. Pt was given one breathing treatment with EMS. Pt is a DNR.

## 2015-08-01 NOTE — ED Provider Notes (Addendum)
[facility] Emergency Department Provider Note  Time seen: 2:34 PM  I have reviewed the triage vital signs and the nursing notes.   HISTORY  Chief Complaint Shortness of Breath    HPI Janet Smith is a 82 y.o. female with a past medical history of hypertension, atrial fibrillation, gastric reflux, CVA, depression, dementia, presents the emergency department with acute shortness of breath. According to twin [place] home the patientbecame rather short of breath today. Patient normally wears 2 L of oxygen and was satting 78%. EMS was called to transport the patient to the hospital. Per EMS there were given reported that the patient has not been coughing nor has had any fever recently. Patient does have a history of COPD. They gave a DuoNeb in route to the hospital, but stated the patient continued to worsen and appears to be having increased shortness of breath. Patient does have a DO NOT RESUSCITATE order. Patient has dementia and cannot contribute to the history currently.     Past Medical History  Diagnosis Date  . Hypertension   . A-fib ([place])   . GERD (gastroesophageal reflux disease)   . Chronic airway obstruction ([place])   . Dysphagia   . CVA (cerebral infarction)   . Cancer ([place])     colon hx  . Macular degeneration   . Depression   . Dementia     There are no active problems to display for this patient.   History reviewed. No pertinent past surgical history.  Current Outpatient Rx  Name  Route  Sig  Dispense  Refill  . acetaminophen (TYLENOL) 325 MG tablet   Oral   Take 650 mg by mouth every 4 (four) hours as needed for mild pain or fever.         Marland Kitchen amLODipine (NORVASC) 10 MG tablet   Oral   Take 10 mg by mouth daily.         . bisacodyl (DULCOLAX) 10 MG suppository   Rectal   Place 10 mg rectally as needed for moderate constipation.         . citalopram (CELEXA) 10 MG tablet   Oral   Take 5 mg by mouth at bedtime.          . Fluticasone-Salmeterol (ADVAIR) 100-50 MCG/DOSE AEPB   Inhalation   Inhale 1 puff into the lungs 2 (two) times daily.         Marland Kitchen levalbuterol (XOPENEX) 1.25 MG/3ML nebulizer solution   Nebulization   Take 1.25 mg by nebulization every 4 (four) hours as needed for shortness of breath.         . loperamide (IMODIUM) 2 MG capsule   Oral   Take 2-4 mg by mouth as needed for diarrhea or loose stools.         . magnesium oxide (MAG-OX) 400 MG tablet   Oral   Take 400 mg by mouth daily.         . memantine (NAMENDA) 5 MG tablet   Oral   Take 5 mg by mouth 2 (two) times daily.         . metoprolol tartrate (LOPRESSOR) 25 MG tablet   Oral   Take 25 mg by mouth 2 (two) times daily.         . mometasone (NASONEX) 50 MCG/ACT nasal spray   Nasal   Place 2 sprays into the nose daily.         . Multiple Vitamin (THEREMS) TABS  Oral   Take 1 tablet by mouth daily.         . Multiple Vitamins-Minerals (PRESERVISION AREDS) TABS   Oral   Take 1 tablet by mouth daily.         . nizatidine (AXID) 150 MG capsule   Oral   Take 150 mg by mouth daily.         Marland Kitchen nystatin (MYCOSTATIN) powder   Topical   Apply 1 g topically 2 (two) times daily as needed (for rash).         Marland Kitchen omeprazole (PRILOSEC) 20 MG capsule   Oral   Take 20 mg by mouth 2 (two) times daily.         . polycarbophil (FIBERCON) 625 MG tablet   Oral   Take 1,250 mg by mouth daily.         . potassium chloride SA (K-DUR,KLOR-CON) 20 MEQ tablet   Oral   Take 20 mEq by mouth 2 (two) times daily.         . rivastigmine (EXELON) 9.5 mg/24hr   Transdermal   Place 9.5 mg onto the skin every evening.         . traMADol (ULTRAM) 50 MG tablet   Oral   Take 50 mg by mouth every 6 (six) hours as needed for moderate pain.         . vitamin B-12 (CYANOCOBALAMIN) 1000 MCG tablet   Oral   Take 1,000 mcg by mouth daily.         Marland Kitchen warfarin (COUMADIN) 2 MG tablet   Oral   Take 2 mg by  mouth daily. Pt takes on Wednesday and Saturday.         . warfarin (COUMADIN) 3 MG tablet   Oral   Take 3 mg by mouth daily. Pt takes on Monday, Tuesday, Thursday, Friday, and Sunday.           Allergies Cephalosporins; Codeine; Eggs or egg-derived products; Fish allergy; Influenza vaccines; Penicillins; and Sulfa antibiotics  No family history on file.  Social History Social History  Substance Use Topics  . Smoking status: Unknown If Ever Smoked  . Smokeless tobacco: None  . Alcohol Use: No    Review of Systems Largely negative review of systems although significantly limited due to dementia and acute dyspnea.  Denies chest pain.  ____________________________________________   PHYSICAL EXAM:  Constitutional: Alert and oriented. Well appearing and in no distress. Eyes: Normal exam ENT   Head: Normocephalic and atraumatic.   Mouth/Throat: Mucous membranes are moist. Cardiovascular: Normal rate, regular rhythm. No murmur Respiratory: Significant wheeze with decreased breath sounds in all lung fields. No rhonchi. Positive for accessory muscle use. Gastrointestinal: Soft and nontender. No distention.   Musculoskeletal: Nontender with normal range of motion in all extremities. Neurologic:  Able to answer some questions however significant dementia. Skin:  Skin is warm, dry and intact.  Psychiatric: Acting appropriate for situation.  ____________________________________________    EKG  EKG reviewed and interpreted, so shows atrial fibrillation at 94 bpm, narrow QRS, normal axis, nonspecific ST changes. No ST elevations.  ____________________________________________    RADIOLOGY  Chest x-ray stable  ____________________________________________   INITIAL IMPRESSION / ASSESSMENT AND PLAN / ED COURSE  Pertinent labs & imaging results that were available during my care of the patient were reviewed by me and considered in my medical decision making (see  chart for details).  Patient with significant respiratory distress, diffuse wheeze in all lung fields, accessory  muscle use, retractions noted. Patient has a DO NOT RESUSCITATE so we will place the patient on BiPAP. We will she would do an abscess, Solu-Medrol, and obtain labs and monitor very closely in the emergency department. Patient will require admission to the hospital for her likely significant COPD exacerbation.   Labs are largely within normal limits. Chest x-ray stable. Presentation most consistent with significant COPD exacerbation. Patient doing much better on BiPAP. We will admit to the hospital for further treatment.   CRITICAL CARE Performed by: Harvest Dark   Total critical care time: 30 minutes  Critical care time was exclusive of separately billable procedures and treating other patients.  Critical care was necessary to treat or prevent imminent or life-threatening deterioration.  Critical care was time spent personally by me on the following activities: development of treatment plan with patient and/or surrogate as well as nursing, discussions with consultants, evaluation of patient's response to treatment, examination of patient, obtaining history from patient or surrogate, ordering and performing treatments and interventions, ordering and review of laboratory studies, ordering and review of radiographic studies, pulse oximetry and re-evaluation of patient's condition.  ____________________________________________   FINAL CLINICAL IMPRESSION(S) / ED DIAGNOSES  COPD exacerbation Dyspnea   Harvest Dark, MD 08/01/15 1553  Harvest Dark, MD 08/01/15 1553

## 2015-08-01 NOTE — Progress Notes (Signed)
Pt came to ED with hypoxemia, sob, wheezing. Improved on Bipap and weaned to 4L [place] before coming to floor.  Has bilat wheezing but no apparent resp distress at this time.  Bipap will be re-initiated if needed.

## 2015-08-01 NOTE — ED Notes (Signed)
Troponin 0.05. MD notified 

## 2015-08-02 LAB — BASIC METABOLIC PANEL
Anion gap: 5 (ref 5–15)
BUN: 33 mg/dL — AB (ref 6–20)
CALCIUM: 7.9 mg/dL — AB (ref 8.9–10.3)
CHLORIDE: 106 mmol/L (ref 101–111)
CO2: 28 mmol/L (ref 22–32)
CREATININE: 1.31 mg/dL — AB (ref 0.44–1.00)
GFR, EST AFRICAN AMERICAN: 40 mL/min — AB (ref >60)
GFR, EST NON AFRICAN AMERICAN: 34 mL/min — AB (ref >60)
Glucose, Bld: 149 mg/dL — ABNORMAL HIGH (ref 65–99)
Potassium: 4 mmol/L (ref 3.5–5.1)
SODIUM: 139 mmol/L (ref 135–145)

## 2015-08-02 LAB — TROPONIN I

## 2015-08-02 LAB — MRSA PCR SCREENING: MRSA BY PCR: NEGATIVE

## 2015-08-02 MED ORDER — CETYLPYRIDINIUM CHLORIDE 0.05 % MT LIQD
7.0000 mL | Freq: Two times a day (BID) | OROMUCOSAL | Status: DC
  Administered 2015-08-02 – 2015-08-06 (×8): 7 mL via OROMUCOSAL

## 2015-08-02 MED ORDER — ALBUTEROL SULFATE (2.5 MG/3ML) 0.083% IN NEBU
2.5000 mg | INHALATION_SOLUTION | Freq: Four times a day (QID) | RESPIRATORY_TRACT | Status: DC | PRN
  Administered 2015-08-02: 18:00:00 2.5 mg via RESPIRATORY_TRACT
  Filled 2015-08-02: qty 3

## 2015-08-02 MED ORDER — TRAMADOL HCL 50 MG PO TABS: 50.0000 mg | ORAL_TABLET | Freq: Two times a day (BID) | ORAL | Status: DC | PRN

## 2015-08-02 NOTE — Progress Notes (Signed)
[facility] [place] NAME: Janet Smith    MR#:  [id number]  DATE OF BIRTH:  08-Apr-1921  SUBJECTIVE:admitted for COPD exacerbation. Patient off BiPAP. Now on 4 L of nasal cannula.   CHIEF COMPLAINT:   Chief Complaint  Patient presents with  . Shortness of Breath    REVIEW OF SYSTEMS:    Review of Systems  Unable to perform ROS: dementia    Nutrition:  Tolerating Diet: Tolerating PT:      DRUG ALLERGIES:   Allergies  Allergen Reactions  . Cephalosporins   . Codeine Other (See Comments)    Reaction:  Unknown   . Eggs Or Egg-Derived Products Other (See Comments)    Reaction:  Unknown   . Fish Allergy Other (See Comments)    Reaction:  Unknown   . Influenza Vaccines Other (See Comments)    Reaction:  Unknown   . Penicillins Other (See Comments)    Reaction:  Unknown   . Sulfa Antibiotics Other (See Comments)    Reaction:  Unknown     VITALS:  Blood pressure 147/97, pulse 82, temperature 97.3 F (36.3 C), temperature source Oral, resp. rate 18, height 5\' 5"  (1.651 m), weight 47.401 kg (104 lb 8 oz), SpO2 92 %.  PHYSICAL EXAMINATION:   Physical Exam  GENERAL:  82 y.o.-year-old patient lying in the bed with no acute distress.  EYES: Pupils equal, round, reactive to light and accommodation. No scleral icterus. Extraocular muscles intact.  HEENT: Head atraumatic, normocephalic. Oropharynx and nasopharynx clear.  NECK:  Supple, no jugular venous distention. No thyroid enlargement, no tenderness.  LUNGS: Normal breath sounds bilaterally, slight wheezing.no rales,rhonchi or crepitation. No use of accessory muscles of respiration.  CARDIOVASCULAR: S1, S2 normal. No murmurs, rubs, or gallops.  ABDOMEN: Soft, nontender, nondistended. Bowel sounds present. No organomegaly or mass.  EXTREMITIES: No pedal edema, cyanosis, or clubbing.  NEUROLOGIC: Unable to do full neurological exam because of her dementia   PSYCHIATRIC:  Awake but not oriented to time or place due to dementia. SKIN: No obvious rash, lesion, or ulcer.    LABORATORY PANEL:   CBC  Recent Labs Lab 08/01/15 1433  WBC 10.3  HGB 12.1  HCT 34.8*  PLT 427   ------------------------------------------------------------------------------------------------------------------  Chemistries   Recent Labs Lab 08/01/15 1433 08/02/15 0445  NA 138 139  K 3.9 4.0  CL 100* 106  CO2 26 28  GLUCOSE 187* 149*  BUN 31* 33*  CREATININE 1.34* 1.31*  CALCIUM 8.6* 7.9*  AST 58*  --   ALT 25  --   ALKPHOS 77  --   BILITOT 0.8  --    ------------------------------------------------------------------------------------------------------------------  Cardiac Enzymes  Recent Labs Lab 08/02/15 0446  TROPONINI <0.03   ------------------------------------------------------------------------------------------------------------------  RADIOLOGY:  Dg Chest Portable 1 View  08/01/2015  CLINICAL DATA:  Shortness of breath for 1 day EXAM: PORTABLE CHEST 1 VIEW COMPARISON:  March 25, 2017 FINDINGS: There is no edema or consolidation. The heart is upper normal in size with pulmonary vascularity within normal limits. Aorta is diffusely tortuous but stable. There is atherosclerotic calcification in the aortic arch region. No adenopathy is apparent. There is an acute appearing fracture of the anterior right seventh rib. No pneumothorax. IMPRESSION: Acute fracture anterior right seventh rib. No pneumothorax. No edema or consolidation. Cardiac silhouette stable allowing for portable technique. Aorta is diffusely tortuous. Electronically Signed   By: Lowella Grip III M.D.   On: 08/01/2015 15:44  ASSESSMENT AND PLAN:   Active Problems:   COPD (chronic obstructive pulmonary disease) (HCC)  COPD exacerbation: Clinically improving, continue oxygen. Continue Solu-Medrol, nebulizers., IV Levaquin. Acute on chronic respiratory failure due to COPD  exacerbation: Patient is off BiPAP, continue oxygen to keep saturations more than 92%. #3 atrial fibrillation: Overall controlled. Continue Lopressor and Epixiban, 4. Dementia: Continue Namenda, Ativan., Exelon patch  All the records are reviewed and case discussed with Care Management/Social Workerr. Management plans discussed with the patient, family and they are in agreement.  CODE STATUS: DO NOT RESUSCITATE  TOTAL TIME TAKING CARE OF THIS PATIENT: 35 minutes.   POSSIBLE D/C IN 1-2 DAYS, DEPENDING ON CLINICAL CONDITION.   Epifanio Lesches M.D on 08/02/2015 at 8:42 AM  Between 7am to 6pm - Pager - [phone]  After 6pm go to [email or web address] - password [email or web address]  [facility]  Office  [phone]  CC: Primary care physician; [place] Pink, MD

## 2015-08-02 NOTE — Evaluation (Addendum)
Clinical/Bedside Swallow Evaluation Patient Details  Name: BANI UM MRN: [id number] Date of Birth: March 12, 1921  Today's Date: 08/02/2015 Time: SLP Start Time (ACUTE ONLY): 0850 SLP Stop Time (ACUTE ONLY): 0950 SLP Time Calculation (min) (ACUTE ONLY): 60 min  Past Medical History:  Past Medical History  Diagnosis Date  . Hypertension   . A-fib ([place])   . GERD (gastroesophageal reflux disease)   . Chronic airway obstruction ([place])   . Dysphagia   . CVA (cerebral infarction)   . Cancer ([place])     colon hx  . Macular degeneration   . Depression   . Dementia    Past Surgical History: History reviewed. No pertinent past surgical history. HPI:  Pt is a 82 y.o. female from [place] home with a known history of Dementia, GERD, dysphagia, depression, atrial fibrillation, macular degeneration, HTN, and chronic airway obstruction who presents with above issue. Patient had increasing shortness of breath this [facility]. She normally wears 2 L oxygen and her O2 saturations were 70%. EMS was called and en route patient is given DuoNeb. She continued to have increasing shortness of breath and respiratory distress. When she arrived to the ER she was placed on BiPAP machine. She has been given 3 DuoNeb's and IV steroids for wheezing. Pt was unable to describe current diet at the [facility].    Assessment / Plan / Recommendation Clinical Impression  Pt appeared to adequately tolerate trials of thin liquids and purees w/ no consistent, overt s/s of aspiration noted; no decline in respiratory status and vocal quality was clear post trials. Pt exhibited an immediate cough x1 trial when she was taking multiple sips and a bit shakier holding the cup. When increaed support was given and verbal cues to follow aspiration precautions was given, no further coughing noted. Oral phase was wfl for trial consistencies assessed. No solids were assessed at this eval sec. to pt's overall weakness and  Cognitive status. Pt appears a min. risk for aspiration but able to tolerate a Dys. 1 w/ thin liquids following aspiration preautions and given support w/ feeding. Rec. initiation of diet w/ strict aspiration precautions; meds in puree. Pt would benefit from ST services for diet toleration and modification as necessary as part of her ADLs.       Aspiration Risk  Mild aspiration risk    Diet Recommendation  Dys. 1 w/ thin liquids; aspiration precautions; Reflux precautions; feeding assistance at meals. Dietician f/u for supplement support.   Medication Administration: Whole meds with puree (or crush if nec. and able)    Other  Recommendations Recommended Consults:  (Dietician) Oral Care Recommendations: Oral care BID;Staff/trained caregiver to provide oral care   Follow up Recommendations  Skilled Nursing facility (TBD)    Frequency and Duration min 3x week  2 weeks       Prognosis Prognosis for Safe Diet Advancement: Fair Barriers to Reach Goals: Cognitive deficits      Swallow Study   General Date of Onset: 08/01/15 HPI: Pt is a 82 y.o. female from [place] [facility] home with a known history of Dementia, GERD, dysphagia, depression, atrial fibrillation, macular degeneration, HTN, and chronic airway obstruction who presents with above issue. Patient had increasing shortness of breath this morning at [facility]. She normally wears 2 L oxygen and her O2 saturations were 70%. EMS was called and en route patient is given DuoNeb. She continued to have increasing shortness of breath and respiratory distress.  When she arrived to the ER she was placed on BiPAP machine. She has been given 3 DuoNeb's and IV steroids for wheezing. Pt was unable to describe current diet at the [facility]  Type of Study: Bedside Swallow Evaluation Previous Swallow Assessment: none indicated Diet Prior to this Study:  (unknown) Temperature Spikes Noted: No (wbc n/a) Respiratory Status: Nasal cannula (3-4  liters) History of Recent Intubation: No Behavior/Cognition: Cooperative;Pleasant mood;Confused;Requires cueing;Distractible (awake) Oral Cavity Assessment: Dry Oral Care Completed by SLP: Yes Oral Cavity - Dentition: Missing dentition Vision:  (n/a) Self-Feeding Abilities: Total assist Patient Positioning: Upright in bed Baseline Vocal Quality: Low vocal intensity (mumbled at times) Volitional Cough: Cognitively unable to elicit (throat cleared adequately) Volitional Swallow: Able to elicit    Oral/Motor/Sensory Function Overall Oral Motor/Sensory Function:  (grossly wfl w/ bolus management)   Ice Chips Ice chips: Within functional limits Presentation: Spoon (fed; 3 trials)   Thin Liquid Thin Liquid: Impaired Presentation: Self Fed;Cup;Straw (~4 ozs total) Oral Phase Impairments:  (none) Oral Phase Functional Implications:  (none) Pharyngeal  Phase Impairments: Cough - Immediate (x1 trial) Other Comments: shaky; eager to drink; support holding cup best    Nectar Thick Nectar Thick Liquid: Not tested   Honey Thick Honey Thick Liquid: Not tested   Puree Puree: Within functional limits Presentation: Spoon (fed; 10 trials) Other Comments: encouragement nec.; pt wanted to drink water instead   Solid   GO   Solid: Not tested        Orinda Kenner, MS, CCC-SLP  Watson,Katherine 08/02/2015,2:02 PM

## 2015-08-02 NOTE — Progress Notes (Signed)
Initial Nutrition Assessment  DOCUMENTATION CODES:   Non-severe (moderate) malnutrition in context of chronic illness  INTERVENTION:  Meals and snacks: Cater to pt preferences within diet restrictions Medical Nutrition Supplement Therapy: Will add magic cup BID for added nutrition and mighty shake q daily   NUTRITION DIAGNOSIS:   Inadequate oral intake related to acute illness, poor appetite as evidenced by meal completion < 25%.    GOAL:   Patient will meet greater than or equal to 90% of their needs    MONITOR:   PO intake, Supplement acceptance  REASON FOR ASSESSMENT:   Malnutrition Screening Tool    ASSESSMENT:      Pt admitted with COPD exacerbation  Past Medical History  Diagnosis Date  . Hypertension   . A-fib ([place])   . GERD (gastroesophageal reflux disease)   . Chronic airway obstruction ([place])   . Dysphagia   . CVA (cerebral infarction)   . Cancer ([place])     colon hx  . Macular degeneration   . Depression   . Dementia     Current Nutrition: took sips per I and O sheet. Pt unable to tell this Probation officer  Food/Nutrition-Related History: Pt unable to tell this Probation officer intake prior to admission   Scheduled Medications:  . albuterol  2.5 mg Nebulization Q6H  . amLODipine  10 mg Oral Daily  . antiseptic oral rinse  7 mL Mouth Rinse q12n4p  . apixaban  2.5 mg Oral BID  . beta carotene w/minerals  1 tablet Oral Daily  . citalopram  5 mg Oral QHS  . [START ON 08/03/2015] levofloxacin (LEVAQUIN) IV  750 mg Intravenous Q48H  . memantine  5 mg Oral BID  . methylPREDNISolone (SOLU-MEDROL) injection  60 mg Intravenous 3 times per day  . metoprolol tartrate  25 mg Oral BID  . mometasone-formoterol  2 puff Inhalation BID  . multivitamin with minerals  1 tablet Oral Daily  . pantoprazole  40 mg Oral QAC breakfast  . pneumococcal 23 valent vaccine  0.5 mL Intramuscular Tomorrow-1000  . polycarbophil  1,250 mg Oral Daily  . potassium chloride SA  20 mEq Oral  BID  . rivastigmine  9.5 mg Transdermal QPM  . sodium chloride flush  3 mL Intravenous Q12H  . vitamin B-12  1,000 mcg Oral Daily    Continuous Medications:  . sodium chloride 75 mL/hr at 08/02/15 1012     Electrolyte/Renal Profile and Glucose Profile:   Recent Labs Lab 08/01/15 1433 08/02/15 0445  NA 138 139  K 3.9 4.0  CL 100* 106  CO2 26 28  BUN 31* 33*  CREATININE 1.34* 1.31*  CALCIUM 8.6* 7.9*  GLUCOSE 187* 149*   Protein Profile:   Recent Labs Lab 08/01/15 1433  ALBUMIN 3.3*    Gastrointestinal Profile: Last BM: WDL   Nutrition-Focused Physical Exam Findings: Nutrition-Focused physical exam completed. Findings are no fat depletion, mild muscle depletion, and no edema.      Weight Change: noted 13% wt loss in the last 4 months per wt encounters    Diet Order:  Diet Heart Room service appropriate?: Yes; Fluid consistency:: Thin  Skin:   reviewed   Height:   Ht Readings from Last 1 Encounters:  08/01/15 5\' 5"  (1.651 m)    Weight:   Wt Readings from Last 1 Encounters:  08/01/15 104 lb 8 oz (47.401 kg)    Ideal Body Weight:     BMI:  Body mass index is 17.39 kg/(m^2).  Estimated  Nutritional Needs:   Kcal:  BEE 880 kcals (IF 1.1-1.3, AF 1.2) [id number] kcals/d  Protein:  (1.0-1.2 g/kg) 47-56 g/d  Fluid:  (30-73ml/kg) 1410-1645 ml/d  EDUCATION NEEDS:   No education needs identified at this time  [place]. Zenia Resides, [place], [place] (pager) Weekend/On-Call pager [phone])

## 2015-08-03 ENCOUNTER — Inpatient Hospital Stay: Payer: Medicare Other

## 2015-08-03 DIAGNOSIS — E440 Moderate protein-calorie malnutrition: Secondary | ICD-10-CM | POA: Insufficient documentation

## 2015-08-03 LAB — BASIC METABOLIC PANEL
Anion gap: 4 — ABNORMAL LOW (ref 5–15)
BUN: 33 mg/dL — AB (ref 6–20)
CALCIUM: 7.5 mg/dL — AB (ref 8.9–10.3)
CO2: 26 mmol/L (ref 22–32)
CREATININE: 1.16 mg/dL — AB (ref 0.44–1.00)
Chloride: 106 mmol/L (ref 101–111)
GFR calc non Af Amer: 40 mL/min — ABNORMAL LOW (ref >60)
GFR, EST AFRICAN AMERICAN: 46 mL/min — AB (ref >60)
Glucose, Bld: 128 mg/dL — ABNORMAL HIGH (ref 65–99)
Potassium: 4.2 mmol/L (ref 3.5–5.1)
Sodium: 136 mmol/L (ref 135–145)

## 2015-08-03 LAB — BLOOD GAS, ARTERIAL
Acid-base deficit: 1.9 mmol/L (ref 0.0–2.0)
Allens test (pass/fail): POSITIVE — AB
BICARBONATE: 25 meq/L (ref 21.0–28.0)
FIO2: 0.4
O2 Saturation: 87.3 %
PCO2 ART: 52 mmHg — AB (ref 32.0–48.0)
PH ART: 7.29 — AB (ref 7.350–7.450)
PO2 ART: 60 mmHg — AB (ref 83.0–108.0)
Patient temperature: 37

## 2015-08-03 MED ORDER — BISACODYL 10 MG RE SUPP
10.0000 mg | Freq: Every day | RECTAL | Status: DC | PRN
  Administered 2015-08-03: 10 mg via RECTAL
  Filled 2015-08-03: qty 1

## 2015-08-03 MED ORDER — LORAZEPAM 2 MG/ML IJ SOLN: 0.5000 mg | Freq: Once | INTRAMUSCULAR | Status: DC

## 2015-08-03 MED ORDER — FAMOTIDINE 20 MG PO TABS
20.0000 mg | ORAL_TABLET | Freq: Every day | ORAL | Status: DC
  Filled 2015-08-03 (×2): qty 1

## 2015-08-03 MED ORDER — DOCUSATE SODIUM 100 MG PO CAPS
100.0000 mg | ORAL_CAPSULE | Freq: Two times a day (BID) | ORAL | Status: DC
  Administered 2015-08-04 – 2015-08-06 (×5): 100 mg via ORAL
  Filled 2015-08-03 (×8): qty 1

## 2015-08-03 MED ORDER — ALBUTEROL SULFATE (2.5 MG/3ML) 0.083% IN NEBU
2.5000 mg | INHALATION_SOLUTION | Freq: Four times a day (QID) | RESPIRATORY_TRACT | Status: DC
  Administered 2015-08-03 – 2015-08-05 (×8): 2.5 mg via RESPIRATORY_TRACT
  Filled 2015-08-03 (×9): qty 3

## 2015-08-03 MED ORDER — FUROSEMIDE 10 MG/ML IJ SOLN
40.0000 mg | Freq: Two times a day (BID) | INTRAMUSCULAR | Status: DC
  Administered 2015-08-03 – 2015-08-05 (×3): 40 mg via INTRAVENOUS
  Filled 2015-08-03 (×3): qty 4

## 2015-08-03 MED ORDER — FUROSEMIDE 10 MG/ML IJ SOLN
40.0000 mg | Freq: Once | INTRAMUSCULAR | Status: AC
  Administered 2015-08-03: 09:00:00 40 mg via INTRAVENOUS
  Filled 2015-08-03: qty 4

## 2015-08-03 MED ORDER — ALBUTEROL SULFATE (2.5 MG/3ML) 0.083% IN NEBU
2.5000 mg | INHALATION_SOLUTION | Freq: Four times a day (QID) | RESPIRATORY_TRACT | Status: DC
  Administered 2015-08-03: 08:00:00 2.5 mg via RESPIRATORY_TRACT
  Filled 2015-08-03: qty 3

## 2015-08-03 MED ORDER — METHYLPREDNISOLONE SODIUM SUCC 125 MG IJ SOLR
125.0000 mg | Freq: Once | INTRAMUSCULAR | Status: AC
  Administered 2015-08-03: 125 mg via INTRAVENOUS
  Filled 2015-08-03: qty 2

## 2015-08-03 NOTE — Progress Notes (Signed)
ABG back, spoke with Dr. Vianne Bulls.  Pt to be moved to CCU and Dr. Vianne Bulls to call family.  Clarise Cruz, RN

## 2015-08-03 NOTE — Care Management Important Message (Signed)
Important Message  Patient Details  Name: Janet Smith MRN: [id number] Date of Birth: 1920-08-16   Medicare Important Message Given:  Yes    Juliann Pulse A Oluwatobi Ruppe 08/03/2015, 10:40 AM

## 2015-08-03 NOTE — Progress Notes (Signed)
Pt with wheezing, Rhonchi, weak cough, and fine crackles this am.  Dr. Vianne Bulls aware.  Fluids decreased but then discontinued altogether.  Respiratory aware and breathing treatment given.  Pt resting comfortably at this time with no signs of distress.  Clarise Cruz, RN

## 2015-08-03 NOTE — Plan of Care (Signed)
Problem: SLP Dysphagia Goals Goal: Misc Dysphagia Goal Pt will safely tolerate po diet of least restrictive consistency w/ no overt s/s of aspiration noted by Staff/pt/family x3 sessions.    

## 2015-08-03 NOTE — Progress Notes (Signed)
Pt niece Grace Blight update via phone.   Clarise Cruz, RN

## 2015-08-03 NOTE — Progress Notes (Cosign Needed)
Speech Language Pathology Treatment: Dysphagia  Patient Details Name: Janet Smith MRN: [id number] DOB: [birth year] Today's Date: 08/03/2015 Time: 0820-0900 SLP Time Calculation (min) (ACUTE ONLY): 40 min  Assessment / Plan / Recommendation Clinical Impression  Pt appears to be tolerating her current pureed diet; exhibited no s/s of aspiration or difficulty w/ mastication of broken down, soft pieces of food representing a Dys. 2 diet consistency. Pt required feeding assistance and frequent rest breaks sec. to quick SOB/WOB w/ the exertion of po trials. Pt continues to have a decline respiratory status at baseline; NSG and RT addressing. Rec. Trial upgrade of diet to a Dys. 2 w/ thin liquids w/ strict aspiration precautions and rest breaks to support respiratory status; continue meds in puree - crushed as able. ST will f/u w/ toleration of diet next 1-3 days. NSG updated and agreed.    HPI HPI: Pt is a 82 y.o. female from [place] [facility] with a known history of Dementia, GERD, dysphagia, depression, atrial fibrillation, macular degeneration, HTN, and chronic airway obstruction who presents with above issue. Patient had increasing shortness of breath this morning at [facility]. She normally wears 2 L oxygen and her O2 saturations were 70%. EMS was called and en route patient is given DuoNeb. She continued to have increasing shortness of breath and respiratory distress. When she arrived to the ER she was placed on BiPAP machine. She has been given 3 DuoNeb's and IV steroids for wheezing. Pt has tolerated current pureed diet per NSG staff but does not like some of the items. She requires feeding assist. She continues w/ txs for her breathing per NSG; RT following for breathing txs.      SLP Plan  Continue with current plan of care     Recommendations  Diet recommendations: Dysphagia 2 (fine chop);Thin liquid Liquids provided via: Cup;Straw Medication Administration: Whole meds with  puree (or crushed as nec/able) Supervision: Full supervision/cueing for compensatory strategies;Trained caregiver to feed patient Compensations: Minimize environmental distractions;Slow rate;Small sips/bites (rest breaks) Postural Changes and/or Swallow Maneuvers: Seated upright 90 degrees             General recommendations:  (Dietician f/u for supplements) Oral Care Recommendations: Oral care BID;Staff/trained caregiver to provide oral care Follow up Recommendations: Skilled Nursing facility Plan: Continue with current plan of care     [place], [place], CCC-SLP  Han Lysne 08/03/2015, 10:50 AM

## 2015-08-03 NOTE — Progress Notes (Signed)
Pt yelling out, saying needs to have BM but can't.  Calling for her mother.  Respirations 30/min and wheezing.  Dr. Vianne Bulls called and new orders received.  Clarise Cruz, RN

## 2015-08-03 NOTE — Progress Notes (Addendum)
[facility] at [place] NAME: Janet Smith    MR#:  [id number]  DATE OF BIRTH:  1920-06-12  SUBJECTIVE; has audible wheezing. And has  More respiratory distress than yesterday. Also is hypoxic with saturations 88% on 3 L.   CHIEF COMPLAINT:   Chief Complaint  Patient presents with  . Shortness of Breath    REVIEW OF SYSTEMS:    Review of Systems  Unable to perform ROS: dementia    Nutrition:  Tolerating Diet: Tolerating PT:      DRUG ALLERGIES:   Allergies  Allergen Reactions  . Cephalosporins   . Codeine Other (See Comments)    Reaction:  Unknown   . Eggs Or Egg-Derived Products Other (See Comments)    Reaction:  Unknown   . Fish Allergy Other (See Comments)    Reaction:  Unknown   . Influenza Vaccines Other (See Comments)    Reaction:  Unknown   . Penicillins Other (See Comments)    Reaction:  Unknown   . Sulfa Antibiotics Other (See Comments)    Reaction:  Unknown     VITALS:  Blood pressure 139/93, pulse 90, temperature 97.8 F (36.6 C), temperature source Oral, resp. rate 18, height 5\' 5"  (1.651 m), weight 47.401 kg (104 lb 8 oz), SpO2 88 %.  PHYSICAL EXAMINATION:   Physical Exam  GENERAL:  82 y.o.-year-old patient lying in the bed with no acute distress.  EYES: Pupils equal, round, reactive to light and accommodation. No scleral icterus. Extraocular muscles intact.  HEENT: Head atraumatic, normocephalic. Oropharynx and nasopharynx clear.  NECK:  Supple, no jugular venous distention. No thyroid enlargement, no tenderness.  LUNGS: Bilateral  expiratory wheeze in all lung fields.bilateral rales present   CARDIOVASCULAR: S1, S2 normal. No murmurs, rubs, or gallops.  ABDOMEN: Soft, nontender, nondistended. Bowel sounds present. No organomegaly or mass.  EXTREMITIES: No pedal edema, cyanosis, or clubbing.  NEUROLOGIC: Unable to do full neurological exam because of her dementia   PSYCHIATRIC: Awake but not  oriented to time or place due to dementia. SKIN: No obvious rash, lesion, or ulcer.    LABORATORY PANEL:   CBC  Recent Labs Lab 08/01/15 1433  WBC 10.3  HGB 12.1  HCT 34.8*  PLT 427   ------------------------------------------------------------------------------------------------------------------  Chemistries   Recent Labs Lab 08/01/15 1433  08/03/15 0417  NA 138  < > 136  K 3.9  < > 4.2  CL 100*  < > 106  CO2 26  < > 26  GLUCOSE 187*  < > 128*  BUN 31*  < > 33*  CREATININE 1.34*  < > 1.16*  CALCIUM 8.6*  < > 7.5*  AST 58*  --   --   ALT 25  --   --   ALKPHOS 77  --   --   BILITOT 0.8  --   --   < > = values in this interval not displayed. ------------------------------------------------------------------------------------------------------------------  Cardiac Enzymes  Recent Labs Lab 08/02/15 1030  TROPONINI <0.03   ------------------------------------------------------------------------------------------------------------------  RADIOLOGY:  Dg Chest Portable 1 View  08/01/2015  CLINICAL DATA:  Shortness of breath for 1 day EXAM: PORTABLE CHEST 1 VIEW COMPARISON:  March 25, 2017 FINDINGS: There is no edema or consolidation. The heart is upper normal in size with pulmonary vascularity within normal limits. Aorta is diffusely tortuous but stable. There is atherosclerotic calcification in the aortic arch region. No adenopathy is apparent. There is an acute appearing fracture  of the anterior right seventh rib. No pneumothorax. IMPRESSION: Acute fracture anterior right seventh rib. No pneumothorax. No edema or consolidation. Cardiac silhouette stable allowing for portable technique. Aorta is diffusely tortuous. Electronically Signed   By: Lowella Grip III M.D.   On: 08/01/2015 15:44     ASSESSMENT AND PLAN:   Active Problems:   COPD (chronic obstructive pulmonary disease) (HCC)   Malnutrition of moderate degree  1.acute respiratory failure due to COPD  exacerbation: Continue nebulizers, IV steroids. Has more wheezing today so we will continue all the treatment . Chest x-ray today. #2 pulmonary edema: Patient fluids are stopped at this time. I gave 1 dose of Lasix. Check stat x-ray. #3 dysphagia: Seen by speech therapy. On dysphagia 1 diet with thin liquids. Patient has dementia causing cognitive deficit #4 .Malnutrition nonsevere in the context of acute illness.  #3 atrial fibrillation: Overall controlled. Continue Lopressor and Epixiban, 4. Dementia: Continue Namenda, Ativan., Exelon patch Pt has respiratory distress and ABG showed respiratory acidosis,will transfer her to ICU for continuous BIPAP and see if it helps. I called her niece,POA  Grace Blight and informed. All the records are reviewed and case discussed with Care Management/Social Workerr. Management plans discussed with the patient, family and they are in agreement.  CODE STATUS: DO NOT RESUSCITATE  TOTAL TIME TAKING CARE OF THIS PATIENT: 35 minutes.   POSSIBLE D/C IN 1-2 DAYS, DEPENDING ON CLINICAL CONDITION.   Epifanio Lesches M.D on 08/03/2015 at 9:01 AM  Between 7am to 6pm - Pager - [phone]  After 6pm go to [email or web address] - password [email or web address] [facility]  [facility]  Office  [phone]  CC: Primary care physician; [place] Pink, MD

## 2015-08-03 NOTE — Clinical Social Work Note (Signed)
Clinical Social Work Assessment  Patient Details  Name: Janet Smith MRN: [id number] Date of Birth: December 18, 1920  Date of referral:  08/03/15               Reason for consult:  Facility Placement                Permission sought to share information with:  Family Supports Permission granted to share information::  Yes, Verbal Permission Granted  Name::     Manuela Schwartz, neice   Housing/Transportation Living arrangements for the past 2 months:  [place] of Information:  Other (Comment Required) (Pt's great niece) Patient Interpreter Needed:  None Criminal Activity/Legal Involvement Pertinent to Current Situation/Hospitalization:  No - Comment as needed Significant Relationships:  Other Family Members Lives with:  Facility Resident Do you feel safe going back to the place where you live?  Yes Need for family participation in patient care:  Yes (Comment)  Care giving concerns:  No care giving concerns identified.   Social Worker assessment / plan:  CSW attempted to reach pt's niece, Manuela Schwartz. CSW spoke with pt's niece's daughter as pt's niece was sleeping. Pt has dementia and is not able to engage in an assessment. CSW introduced herself and explained role of social work. Pt is a long term resident of [facility] and will be able to to return at discharge. Pt's grandniece is agreeable to this. CSW will attempt to speak with niece prior to discharge. CSW will continue to follow.   Employment status:  Retired Nurse, adult PT Recommendations:  Not assessed at this time [place] / Referral to community resources:   [place]  Patient/Family's Response to care:  Pt's grandniece was appreciative of CSW support.   Patient/Family's Understanding of and Emotional Response to Diagnosis, Current Treatment, and Prognosis:  Pt's grandniece understands that pt will need to return to [facility] for LTC.  Emotional Assessment Appearance:  Appears stated  age Attitude/Demeanor/Rapport:  Unable to Assess Affect (typically observed):  Unable to Assess Orientation:  Oriented to Self Alcohol / Substance use:  Never Used Psych involvement (Current and /or in the community):  No (Comment)  Discharge Needs  Concerns to be addressed:  Adjustment to Illness Readmission within the last 30 days:  No Current discharge risk:  Chronically ill Barriers to Discharge:  Continued Medical Work up   Darden Dates, LCSW 08/03/2015, 4:42 PM

## 2015-08-04 DIAGNOSIS — L8990 Pressure ulcer of unspecified site, unspecified stage: Secondary | ICD-10-CM | POA: Insufficient documentation

## 2015-08-04 LAB — MAGNESIUM: Magnesium: 1.8 mg/dL (ref 1.7–2.4)

## 2015-08-04 NOTE — Progress Notes (Signed)
Pharmacy ICU Daily Progress Note  Janet Smith is a 82yo female admitted 08/01/15 for acute respiratory failure 2/2 COPD exacerbation.   PMH significant for Afib (metoprolol, apixaban), dementia (namenda, exelon), COPD, and dysphagia.  Active pharmacy consults: none  Infectious:  Antimicrobials:  Levaquin 3/21>> WBC 10.3 Afebrile Culture Results: MRSA PCR (-)  Electrolytes:  Potassium: 4.2 Magnesium: none Phosphorus: none Supplementation plans: none  Pulmonary: Bronchodilators? Dulera BID, albuterol nebs Ventilator status: No O2 sat/FiO2: 94 on 3LNC  Current steroids: methylprednisolone 60mg  q8hrs Taper plans: ?  GI:  Constipation PPx: bisacodyl+senna-docusate prn, docusate 100mg  PO BID Feeding status: dysphagia diet LBM: 3/21 SUP: pantoprazole daily before breakfast  Insulin:  SSI use in 24hrs: none Current Insulin orders: none Last 3 CBGs: 128, 149, 187  DVT PPx: apixaban 2.5mg  PO BID  Sedation+Pain: none RASS goal? 0 GCS 14 Last pain score: 0 Opioid use in last 24 hrs:   Pressors: none MAP goal:   Medication education/counseling required?

## 2015-08-04 NOTE — Care Management (Signed)
Patient was transferred from 1C to "telemetry."  Apparently there was not a bed available on 2A and placed in bed in icu.  Am assuming patient in "floor care."  She presents from [facility] and it is anticipated she will return under long term care plan of treatment

## 2015-08-04 NOTE — Progress Notes (Signed)
[facility] [place] at [place] NAME: Janet Smith    MR#:  [id number]  DATE OF BIRTH:  10/08/1920  SUBJECTIVE;transferred to see her for possible BiPAP but she was never started on BiPAP. Patient had  Lasix  IVhen she was on the floor yesterday and she had a lot of good urine output and respirations  Got better without further need for BiPAP. Received Ativan last night for agitation. Now she is refusing by mouth medications.   CHIEF COMPLAINT:   Chief Complaint  Patient presents with  . Shortness of Breath    REVIEW OF SYSTEMS:    Review of Systems  Unable to perform ROS: dementia    Nutrition:  Tolerating Diet: Tolerating PT:      DRUG ALLERGIES:   Allergies  Allergen Reactions  . Cephalosporins   . Codeine Other (See Comments)    Reaction:  Unknown   . Eggs Or Egg-Derived Products Other (See Comments)    Reaction:  Unknown   . Fish Allergy Other (See Comments)    Reaction:  Unknown   . Influenza Vaccines Other (See Comments)    Reaction:  Unknown   . Penicillins Other (See Comments)    Reaction:  Unknown   . Sulfa Antibiotics Other (See Comments)    Reaction:  Unknown     VITALS:  Blood pressure 113/73, pulse 90, temperature 98.4 F (36.9 C), temperature source Oral, resp. rate 19, height 5\' 5"  (1.651 m), weight 47.401 kg (104 lb 8 oz), SpO2 94 %.  PHYSICAL EXAMINATION:   Physical Exam  GENERAL:  82 y.o.-year-old patient lying in the bed with no acute distress.  EYES: Pupils equal, round, reactive to light and accommodation. No scleral icterus. Extraocular muscles intact.  HEENT: Head atraumatic, normocephalic. Oropharynx and nasopharynx clear.  NECK:  Supple, no jugular venous distention. No thyroid enlargement, no tenderness.  LUNGS: Bilateral  expiratory wheeze in all lung fields.bilateral rales present   CARDIOVASCULAR: S1, S2 normal. No murmurs, rubs, or gallops.  ABDOMEN: Soft, nontender, nondistended. Bowel  sounds present. No organomegaly or mass.  EXTREMITIES: No pedal edema, cyanosis, or clubbing.  NEUROLOGIC: Unable to do full neurological exam because of her dementia   PSYCHIATRIC: Awake but not oriented to time or place due to dementia. SKIN: No obvious rash, lesion, or ulcer.    LABORATORY PANEL:   CBC  Recent Labs Lab 08/01/15 1433  WBC 10.3  HGB 12.1  HCT 34.8*  PLT 427   ------------------------------------------------------------------------------------------------------------------  Chemistries   Recent Labs Lab 08/01/15 1433  08/03/15 0417  NA 138  < > 136  K 3.9  < > 4.2  CL 100*  < > 106  CO2 26  < > 26  GLUCOSE 187*  < > 128*  BUN 31*  < > 33*  CREATININE 1.34*  < > 1.16*  CALCIUM 8.6*  < > 7.5*  AST 58*  --   --   ALT 25  --   --   ALKPHOS 77  --   --   BILITOT 0.8  --   --   < > = values in this interval not displayed. ------------------------------------------------------------------------------------------------------------------  Cardiac Enzymes  Recent Labs Lab 08/02/15 1030  TROPONINI <0.03   ------------------------------------------------------------------------------------------------------------------  RADIOLOGY:  Dg Chest 1 View  08/03/2015  CLINICAL DATA:  Shortness of breath and wheezing, worsening symptoms. On home oxygen, known atrial fibrillation, history of COPD EXAM: CHEST 1 VIEW COMPARISON:  Portable chest x-ray  of August 01, 2015 FINDINGS: The lungs are adequately inflated. Subtle increased density present at both lung bases. Small amounts of pleural fluid are suspected bilaterally. The cardiac silhouette is mildly enlarged. The pulmonary vascularity is engorged and indistinct. The trachea is midline. There is tortuosity of the descending thoracic aorta. IMPRESSION: COPD with superimposed CHF with mild pulmonary interstitial edema. Small bilateral pleural effusions are suspected. Minimal bibasilar atelectasis is suspected as well.  Electronically Signed   By: David  Martinique M.D.   On: 08/03/2015 09:24     ASSESSMENT AND PLAN:   Active Problems:   COPD (chronic obstructive pulmonary disease) (HCC)   Malnutrition of moderate degree   Pressure ulcer  1.acute respiratory failure due to COPD exacerbation: Continue nebulizers, IV steroids. Respiratory distress improved. Continue  nebulizers,steroids. Patient can be transferred from ICU to regular floor. Patient getting much better today.   #2 pulmonary edema: Improved. #3 dysphagia: Seen by speech therapy. On dysphagia 1 diet with thin liquids. Patient has dementia causing cognitive deficit #4 .Malnutrition nonsevere in the context of acute illness.  #3 atrial fibrillation: Overall controlled. Continue Lopressor and Epixiban, 4. Dementia: Continue Namenda, Ativan., Exelon patch Palliative care consult because of advanced age, dementia, COPD, respiratory distress,malnutrition, advanced dementia and refusing  medications.   All the records are reviewed and case discussed with Care Management/Social Workerr. Management plans discussed with the patient, family and they are in agreement.  CODE STATUS: DO NOT RESUSCITATE  TOTAL TIME TAKING CARE OF THIS PATIENT: 35 minutes.   POSSIBLE D/C IN 1-2 DAYS, DEPENDING ON CLINICAL CONDITION.   Epifanio Lesches M.D on 08/04/2015 at 1:07 PM  Between 7am to 6pm - Pager - [phone]  After 6pm go to [email or web address] - password [email or web address] [facility]  [facility] [facility]  Office  [phone]  CC: Primary care physician; [place] Pink, MD

## 2015-08-05 LAB — CBC
HCT: 33.1 % — ABNORMAL LOW (ref 35.0–47.0)
Hemoglobin: 11.3 g/dL — ABNORMAL LOW (ref 12.0–16.0)
MCH: 35.1 pg — AB (ref 26.0–34.0)
MCHC: 34.3 g/dL (ref 32.0–36.0)
MCV: 102.5 fL — ABNORMAL HIGH (ref 80.0–100.0)
PLATELETS: 347 10*3/uL (ref 150–440)
RBC: 3.23 MIL/uL — ABNORMAL LOW (ref 3.80–5.20)
RDW: 15.4 % — AB (ref 11.5–14.5)
WBC: 9.4 10*3/uL (ref 3.6–11.0)

## 2015-08-05 MED ORDER — METHYLPREDNISOLONE SODIUM SUCC 40 MG IJ SOLR
40.0000 mg | Freq: Three times a day (TID) | INTRAMUSCULAR | Status: DC
  Administered 2015-08-05 – 2015-08-06 (×2): 40 mg via INTRAVENOUS
  Filled 2015-08-05 (×2): qty 1

## 2015-08-05 MED ORDER — ALBUTEROL SULFATE (2.5 MG/3ML) 0.083% IN NEBU
2.5000 mg | INHALATION_SOLUTION | Freq: Three times a day (TID) | RESPIRATORY_TRACT | Status: DC
Start: 2015-08-05 — End: 2015-08-06
  Administered 2015-08-05 – 2015-08-06 (×3): 2.5 mg via RESPIRATORY_TRACT
  Filled 2015-08-05 (×3): qty 3

## 2015-08-05 MED ORDER — LEVOFLOXACIN 750 MG PO TABS: 750.0000 mg | ORAL_TABLET | Freq: Every day | ORAL | Status: DC

## 2015-08-05 MED ORDER — METOPROLOL TARTRATE 50 MG PO TABS
50.0000 mg | ORAL_TABLET | Freq: Two times a day (BID) | ORAL | Status: DC
  Administered 2015-08-05 – 2015-08-06 (×2): 50 mg via ORAL
  Filled 2015-08-05 (×2): qty 1

## 2015-08-05 NOTE — Consult Note (Signed)
Palliative Medicine Inpatient Consult Note   Name: Janet Smith Date: 08/05/2015 MRN: [id number]  DOB: 04/27/1921  Referring Physician: Epifanio Lesches, MD  Palliative Care consult requested for this 82 y.o. female for goals of medical therapy in patient with COPD exacerbation.     BRIEF HISTORY and RECOMMENDATIONS:  She was on ABX at [facility] and had completed a course on 3/19 when she was brought here with sats of 70'2 to into the 80's.  Here on arrival it was 88%.  She was placed on BIPAP here and required ICU on 3/21 b/c of the need for continuous BIPAP. She was able to be transitioned to nasal cannula O2 and now is stable on home home regimen of 2 LPM of oxygen. Pt was also constipated, but this was treated.     Pt has a STAGE 1 sacral pressure area on sacrum --present at admission (it isn't unstageable per nursing) --I have not yet seen this.  There is a foam dressing at this site for protective effect.   I have not had a chance to see or call family. I have learned that pt is ready now for discharge to facility.  Cr today is 1.47 with glucose 207 and hgb 11.3.    Recommendation is for a Palliative Care Consult in the facility.  She has severe Alzheimer's Dementia and is very likely approaching time when Hospice would be helpful for pt.  Would pay attention to oral intake as it is quite poor.    IMPRESSION Acute on chronic resp failure with hypoxia ---due to COPD exacerbation Dementia-alzheimers type AFib --with occasional rapid response  Dysphagia due to dementia ---on Dys 2 with thins diet curently Malnutrition ---must be fed by staff ---only accepting a few bites here daily HTN CVA H/O past colon Cancer Macular degeneration Depression  GERD Mild Troponin elevation due to demand ischemia CKD with Cr 1.34 but Cr clearance 20 ml/min    REVIEW OF SYSTEMS:  Patient is not able to provide ROS due to illness and lethargy  SPIRITUAL SUPPORT SYSTEM: Yes.  SOCIAL  HISTORY:  reports that she does not drink alcohol. She resides at [facility].   LEGAL DOCUMENTS:  I have rewritten her portable DNR form that was first done in 2010 --since a new one that is legible and more current is appropriate now.   CODE STATUS: DNR  PAST MEDICAL HISTORY: Past Medical History  Diagnosis Date  . Hypertension   . A-fib ([place])   . GERD (gastroesophageal reflux disease)   . Chronic airway obstruction ([place])   . Dysphagia   . CVA (cerebral infarction)   . Cancer ([place])     colon hx  . Macular degeneration   . Depression   . Dementia     PAST SURGICAL HISTORY: History reviewed. No pertinent past surgical history.  ALLERGIES:  is allergic to cephalosporins; codeine; eggs or egg-derived products; fish allergy; influenza vaccines; penicillins; and sulfa antibiotics.  MEDICATIONS:  Current Facility-Administered Medications  Medication Dose Route Frequency Provider Last Rate Last Dose  . acetaminophen (TYLENOL) tablet 650 mg  650 mg Oral Q6H PRN Bettey Costa, MD   650 mg at 08/04/15 2221   Or  . acetaminophen (TYLENOL) suppository 650 mg  650 mg Rectal Q6H PRN Bettey Costa, MD      . albuterol (PROVENTIL) (2.5 MG/3ML) 0.083% nebulizer solution 2.5 mg  2.5 mg Nebulization Q6H Epifanio Lesches, MD   2.5 mg at 08/05/15 1358  . alum &  mag hydroxide-simeth (MAALOX/MYLANTA) 200-200-20 MG/5ML suspension 30 mL  30 mL Oral Q6H PRN Sital Mody, MD      . amLODipine (NORVASC) tablet 10 mg  10 mg Oral Daily Bettey Costa, MD   10 mg at 08/05/15 1040  . antiseptic oral rinse (CPC / CETYLPYRIDINIUM CHLORIDE 0.05%) solution 7 mL  7 mL Mouth Rinse q12n4p Sital Mody, MD   7 mL at 08/05/15 1000  . apixaban (ELIQUIS) tablet 2.5 mg  2.5 mg Oral BID Bettey Costa, MD   2.5 mg at 08/05/15 1039  . beta carotene w/minerals (OCUVITE) tablet 1 tablet  1 tablet Oral Daily Bettey Costa, MD   1 tablet at 08/05/15 1046  . bisacodyl (DULCOLAX) suppository 10 mg  10 mg Rectal Daily PRN  Epifanio Lesches, MD   10 mg at 08/03/15 1210  . citalopram (CELEXA) tablet 5 mg  5 mg Oral QHS Bettey Costa, MD   5 mg at 08/04/15 2158  . docusate sodium (COLACE) capsule 100 mg  100 mg Oral BID Epifanio Lesches, MD   100 mg at 08/05/15 1040  . [START ON 08/07/2015] levofloxacin (LEVAQUIN) tablet 750 mg  750 mg Oral Daily Epifanio Lesches, MD      . LORazepam (ATIVAN) injection 0.5 mg  0.5 mg Intravenous Once Epifanio Lesches, MD   0.5 mg at 08/03/15 1336  . LORazepam (ATIVAN) tablet 0.5 mg  0.5 mg Oral Q6H PRN Bettey Costa, MD   0.5 mg at 08/03/15 1244  . memantine (NAMENDA) tablet 5 mg  5 mg Oral BID Bettey Costa, MD   5 mg at 08/05/15 1040  . methylPREDNISolone sodium succinate (SOLU-MEDROL) 40 mg/mL injection 40 mg  40 mg Intravenous 3 times per day Epifanio Lesches, MD      . metoprolol (LOPRESSOR) tablet 50 mg  50 mg Oral BID Epifanio Lesches, MD      . mometasone-formoterol (DULERA) 100-5 MCG/ACT inhaler 2 puff  2 puff Inhalation BID Bettey Costa, MD   2 puff at 08/05/15 0800  . multivitamin with minerals tablet 1 tablet  1 tablet Oral Daily Bettey Costa, MD   1 tablet at 08/05/15 1038  . ondansetron (ZOFRAN) tablet 4 mg  4 mg Oral Q6H PRN Bettey Costa, MD       Or  . ondansetron (ZOFRAN) injection 4 mg  4 mg Intravenous Q6H PRN Sital Mody, MD      . pantoprazole (PROTONIX) EC tablet 40 mg  40 mg Oral QAC breakfast Bettey Costa, MD   40 mg at 08/05/15 1040  . pneumococcal 23 valent vaccine (PNU-IMMUNE) injection 0.5 mL  0.5 mL Intramuscular Tomorrow-1000 Sital Mody, MD   0.5 mL at 08/02/15 1025  . polycarbophil (FIBERCON) tablet 1,250 mg  1,250 mg Oral Daily Bettey Costa, MD   1,250 mg at 08/05/15 1046  . potassium chloride SA (K-DUR,KLOR-CON) CR tablet 20 mEq  20 mEq Oral BID Bettey Costa, MD   20 mEq at 08/05/15 1039  . rivastigmine (EXELON) 9.5 mg/24hr 9.5 mg  9.5 mg Transdermal QPM Bettey Costa, MD   9.5 mg at 08/04/15 1852  . senna-docusate (Senokot-S) tablet 1 tablet  1 tablet Oral QHS  PRN Bettey Costa, MD      . sodium chloride flush (NS) 0.9 % injection 3 mL  3 mL Intravenous Q12H Sital Mody, MD   3 mL at 08/05/15 1044  . traMADol (ULTRAM) tablet 50 mg  50 mg Oral Q12H PRN Epifanio Lesches, MD      . vitamin  B-12 (CYANOCOBALAMIN) tablet 1,000 mcg  1,000 mcg Oral Daily Bettey Costa, MD   1,000 mcg at 08/05/15 1038    Vital Signs: BP 106/49 mmHg  Pulse 115  Temp(Src) 97.6 F (36.4 C) (Oral)  Resp 18  Ht 5\' 5"  (1.651 m)  Wt 47.401 kg (104 lb 8 oz)  BMI 17.39 kg/m2  SpO2 92% Filed Weights   08/01/15 1428 08/01/15 1948  Weight: 53.071 kg (117 lb) 47.401 kg (104 lb 8 oz)    Estimated body mass index is 17.39 kg/(m^2) as calculated from the following:   Height as of this encounter: 5\' 5"  (1.651 m).   Weight as of this encounter: 47.401 kg (104 lb 8 oz).  PERFORMANCE STATUS (ECOG) : 4 - Bedbound  PHYSICAL EXAM: Lying on right side in medical bed---I had DCd Telemetry earlier Pt is sleeping --she wakens briefly. She is in an extreme fetal position with feet pulled up nearly to her hips --but she is well positioned with protective pillows for comfort She is pale with white hair Eyes closed Nares patent OP --lips nl No gross facial assymetry No JVD or TM Hrt rrr with occas irreg beats Lungs with decreased BS bases  Abd soft and NT Legs --no ulcers that I could see ---unable to view right foot entirely       LABS: CBC:    Component Value Date/Time   WBC 9.4 08/05/2015 0431   WBC 13.6* 10/09/2012 1035   HGB 11.3* 08/05/2015 0431   HGB 12.1 10/09/2012 1035   HCT 33.1* 08/05/2015 0431   HCT 35.3 10/09/2012 1035   PLT 347 08/05/2015 0431   PLT 433 10/09/2012 1035   MCV 102.5* 08/05/2015 0431   MCV 98 10/09/2012 1035   NEUTROABS 5.3 03/24/2015 1714   LYMPHSABS 1.8 03/24/2015 1714   MONOABS 1.5* 03/24/2015 1714   EOSABS 0.2 03/24/2015 1714   BASOSABS 0.1 03/24/2015 1714   Comprehensive Metabolic Panel:    Component Value Date/Time   NA 136  08/03/2015 0417   NA 135* 10/09/2012 1035   K 4.2 08/03/2015 0417   K 3.2* 10/09/2012 1035   CL 106 08/03/2015 0417   CL 95* 10/09/2012 1035   CO2 26 08/03/2015 0417   CO2 32 10/09/2012 1035   BUN 33* 08/03/2015 0417   BUN 24* 10/09/2012 1035   CREATININE 1.16* 08/03/2015 0417   CREATININE 1.63* 10/09/2012 1035   GLUCOSE 128* 08/03/2015 0417   GLUCOSE 222* 10/09/2012 1035   CALCIUM 7.5* 08/03/2015 0417   CALCIUM 9.3 10/09/2012 1035   AST 58* 08/01/2015 1433   AST 43* 10/09/2012 1035   ALT 25 08/01/2015 1433   ALT 24 10/09/2012 1035   ALKPHOS 77 08/01/2015 1433   ALKPHOS 93 10/09/2012 1035   BILITOT 0.8 08/01/2015 1433   BILITOT 0.3 10/09/2012 1035   PROT 7.1 08/01/2015 1433   PROT 8.0 10/09/2012 1035   ALBUMIN 3.3* 08/01/2015 1433   ALBUMIN 3.3* 10/09/2012 1035     More than 50% of the visit was spent in counseling/coordination of care: Yes  Time Spent: 80 minutes

## 2015-08-05 NOTE — Progress Notes (Signed)
[facility] [facility] [place] NAME: Janet Smith    MR#:  [id number]  DATE OF BIRTH:  08/17/1920  Seen today.,has severe dementia,unable to give any meaningful history,no  Obvious respiratory distress.  CHIEF COMPLAINT:   Chief Complaint  Patient presents with  . Shortness of Breath    REVIEW OF SYSTEMS:    Review of Systems  Unable to perform ROS: dementia    Nutrition:  Tolerating Diet: Tolerating PT:      DRUG ALLERGIES:   Allergies  Allergen Reactions  . Cephalosporins   . Codeine Other (See Comments)    Reaction:  Unknown   . Eggs Or Egg-Derived Products Other (See Comments)    Reaction:  Unknown   . Fish Allergy Other (See Comments)    Reaction:  Unknown   . Influenza Vaccines Other (See Comments)    Reaction:  Unknown   . Penicillins Other (See Comments)    Reaction:  Unknown   . Sulfa Antibiotics Other (See Comments)    Reaction:  Unknown     VITALS:  Blood pressure 106/49, pulse 115, temperature 97.6 F (36.4 C), temperature source Oral, resp. rate 18, height 5\' 5"  (1.651 m), weight 47.401 kg (104 lb 8 oz), SpO2 92 %.  PHYSICAL EXAMINATION:   Physical Exam  GENERAL:  82 y.o.-year-old patient lying in the bed with no acute distress.  EYES: Pupils equal, round, reactive to light and accommodation. No scleral icterus. Extraocular muscles intact.  HEENT: Head atraumatic, normocephalic. Oropharynx and nasopharynx clear.  NECK:  Supple, no jugular venous distention. No thyroid enlargement, no tenderness.  LUNGS: mostly clear to auscultation.  CARDIOVASCULAR: S1, S2 normal. No murmurs, rubs, or gallops.  ABDOMEN: Soft, nontender, nondistended. Bowel sounds present. No organomegaly or mass.  EXTREMITIES: No pedal edema, cyanosis, or clubbing.  NEUROLOGIC: Unable to do full neurological exam because of her dementia   PSYCHIATRIC: Awake but not oriented to time or place due to dementia. SKIN: No obvious rash,  lesion, or ulcer.    LABORATORY PANEL:   CBC  Recent Labs Lab 08/05/15 0431  WBC 9.4  HGB 11.3*  HCT 33.1*  PLT 347   ------------------------------------------------------------------------------------------------------------------  Chemistries   Recent Labs Lab 08/01/15 1433  08/03/15 0417 08/04/15 1323  NA 138  < > 136  --   K 3.9  < > 4.2  --   CL 100*  < > 106  --   CO2 26  < > 26  --   GLUCOSE 187*  < > 128*  --   BUN 31*  < > 33*  --   CREATININE 1.34*  < > 1.16*  --   CALCIUM 8.6*  < > 7.5*  --   MG  --   --   --  1.8  AST 58*  --   --   --   ALT 25  --   --   --   ALKPHOS 77  --   --   --   BILITOT 0.8  --   --   --   < > = values in this interval not displayed. ------------------------------------------------------------------------------------------------------------------  Cardiac Enzymes  Recent Labs Lab 08/02/15 1030  TROPONINI <0.03   ------------------------------------------------------------------------------------------------------------------  RADIOLOGY:  No results found.   ASSESSMENT AND PLAN:   Active Problems:   COPD (chronic obstructive pulmonary disease) (HCC)   Malnutrition of moderate degree   Pressure ulcer  1.acute respiratory failure due to COPD exacerbation:  Continue nebulizers, IV steroids. Respiratory distress improved. Continue  Nebulizers,adjust IV steroids..   #2 pulmonary edema: Improved.d/c iv lasix.order BMP for am, #3 .dysphagia: Seen by speech therapy. On dysphagia 1 diet with thin liquids. Patient has dementia causing cognitive deficit #4 .Malnutrition nonsevere in the context of acute illness.  #3 atrial fibrillation: slightly tachycardic..  ,adjust lopressor dose,, Epixiban, 4. Dementia: Continue Namenda, Ativan., Exelon patch  5.pressure Ulcer;unstageable.;  Palliative care consult because of advanced age, dementia, COPD, respiratory distress,malnutrition, advanced dementia and refusing   medications.   All the records are reviewed and case discussed with Care Management/Social Workerr. Management plans discussed with the patient, family and they are in agreement.  CODE STATUS: DO NOT RESUSCITATE  TOTAL TIME TAKING CARE OF THIS PATIENT: 35 minutes.   POSSIBLE D/C IN 1-2 DAYS, DEPENDING ON CLINICAL CONDITION.   Epifanio Lesches M.D on 08/05/2015 at 4:04 PM  Between 7am to 6pm - Pager - [phone]  After 6pm go to [email or web address] - password [email or web address] [facility]  [place] [facility]  Office  [phone]  CC: Primary care physician; [place] Pink, MD

## 2015-08-05 NOTE — Care Management Important Message (Signed)
Important Message  Patient Details  Name: Janet Smith MRN: [id number] Date of Birth: June 30, 1920   Medicare Important Message Given:  Yes    Juliann Pulse A [place] 08/05/2015, 11:36 AM

## 2015-08-05 NOTE — Plan of Care (Signed)
Problem: Respiratory: Goal: Complications related to the disease process, condition or treatment will be avoided or minimized Outcome: Progressing Remains on Oyygen at 2L via Nasal Cannula. VSS.

## 2015-08-05 NOTE — Clinical Documentation Improvement (Signed)
Internal Medicine  Can the diagnosis of pressure ulcer be further specified?   Document if pressure ulcer with stage is Present on Admission   Document Site with laterality - Elbow, Back (upper/lower), Sacral, Hip, Buttock, Ankle, Heel, Head, Other (Specify)  Pressure Ulcer Stage - Stage1, Stage 2, Stage 3, Stage 4, Unstageable, Unspecified, Unable to Clinically Determine  Other  Clinically Undetermined  Please update your documentation within the medical record to reflect your response to this query. Thank you.  Supporting Information: (As per notes) "Pressure Ulcer" Noted 08-03-14 by Nursing   Right Ischial tuberosity Stage 1 - Intact skin with non-blanchable redness of a localized area usually over a bony prominence.  Please exercise your independent, professional judgment when responding. A specific answer is not anticipated or expected.  Thank You, Alessandra Grout, RN, BSN, CCDS,Clinical Documentation Specialist:  [phone]  [phone]=[facility] [place]- Health Information Management

## 2015-08-06 DIAGNOSIS — Z79899 Other long term (current) drug therapy: Secondary | ICD-10-CM

## 2015-08-06 DIAGNOSIS — E46 Unspecified protein-calorie malnutrition: Secondary | ICD-10-CM

## 2015-08-06 DIAGNOSIS — I129 Hypertensive chronic kidney disease with stage 1 through stage 4 chronic kidney disease, or unspecified chronic kidney disease: Secondary | ICD-10-CM

## 2015-08-06 DIAGNOSIS — G301 Alzheimer's disease with late onset: Secondary | ICD-10-CM

## 2015-08-06 DIAGNOSIS — L89151 Pressure ulcer of sacral region, stage 1: Secondary | ICD-10-CM

## 2015-08-06 DIAGNOSIS — J9621 Acute and chronic respiratory failure with hypoxia: Secondary | ICD-10-CM

## 2015-08-06 DIAGNOSIS — N189 Chronic kidney disease, unspecified: Secondary | ICD-10-CM

## 2015-08-06 DIAGNOSIS — Z515 Encounter for palliative care: Secondary | ICD-10-CM

## 2015-08-06 DIAGNOSIS — Z8673 Personal history of transient ischemic attack (TIA), and cerebral infarction without residual deficits: Secondary | ICD-10-CM

## 2015-08-06 DIAGNOSIS — F0280 Dementia in other diseases classified elsewhere without behavioral disturbance: Secondary | ICD-10-CM

## 2015-08-06 DIAGNOSIS — R630 Anorexia: Secondary | ICD-10-CM

## 2015-08-06 DIAGNOSIS — J441 Chronic obstructive pulmonary disease with (acute) exacerbation: Principal | ICD-10-CM

## 2015-08-06 LAB — BASIC METABOLIC PANEL
ANION GAP: 6 (ref 5–15)
BUN: 49 mg/dL — AB (ref 6–20)
CALCIUM: 8.2 mg/dL — AB (ref 8.9–10.3)
CO2: 38 mmol/L — AB (ref 22–32)
CREATININE: 1.47 mg/dL — AB (ref 0.44–1.00)
Chloride: 96 mmol/L — ABNORMAL LOW (ref 101–111)
GFR calc Af Amer: 34 mL/min — ABNORMAL LOW (ref >60)
GFR, EST NON AFRICAN AMERICAN: 30 mL/min — AB (ref >60)
GLUCOSE: 207 mg/dL — AB (ref 65–99)
Potassium: 3.4 mmol/L — ABNORMAL LOW (ref 3.5–5.1)
Sodium: 140 mmol/L (ref 135–145)

## 2015-08-06 MED ORDER — LEVOFLOXACIN 750 MG PO TABS: 750.0000 mg | ORAL_TABLET | Freq: Every day | ORAL | Status: AC

## 2015-08-06 MED ORDER — PREDNISONE 10 MG (21) PO TBPK: 10.0000 mg | ORAL_TABLET | Freq: Every day | ORAL | Status: AC

## 2015-08-06 MED ORDER — BISACODYL 10 MG RE SUPP: 10.0000 mg | Freq: Every day | RECTAL | Status: AC | PRN

## 2015-08-06 NOTE — Discharge Summary (Signed)
Janet Smith, is a 82 y.o. female  DOB 01/12/1921  MRN [id number].  Admission date:  08/01/2015  Admitting Physician  Bettey Costa, MD  Discharge Date:  08/06/2015   Primary MD  [place] Pink, MD  Recommendations for primary care physician for things to follow:  Follow-up with primary doctor in 1 week.   Admission Diagnosis  COPD exacerbation ([place]) [J44.1]   Discharge Diagnosis  COPD exacerbation ([place]) [J44.1]    Active Problems:   COPD (chronic obstructive pulmonary disease) (HCC)   Malnutrition of moderate degree   Pressure ulcer      Past Medical History  Diagnosis Date  . Hypertension   . A-fib ([place])   . GERD (gastroesophageal reflux disease)   . Chronic airway obstruction ([place])   . Dysphagia   . CVA (cerebral infarction)   . Cancer ([place])     colon hx  . Macular degeneration   . Depression   . Dementia     History reviewed. No pertinent past surgical history.     History of present illness and  Hospital Course:     Kindly see H&P for history of present illness and admission details, please review complete Labs, Consult reports and Test reports for all details in brief  HPI  from the history and physical done on the day of admission  82 year old female patient with significant dementia brought in because of forearm shortness of breath, admitted for acute on chronic respiratory failure secondary to COPD exacerbation.  Hospital Course  1 acute on chronic respiratory failure secondary to COPD exacerbation. Patient had lots of wheezing and shortness of breath. Started IV steroids, nebulizers, empiric Levaquin.. Chest x-ray did not show any pneumonia. patient  respiratory status worsened, patient moved to ICU for possible need for BiPAP. Patient never required BiPAP. Right now she has no wheezing. She is  stable for discharge on tapering course of steroids, Levaquin, continue nebulizers in the nursing home. #2 pulmonary edema: Patient did have a lot of rales and chest x-ray repeated it showed a fluid overload. Patient received iv  fluids and she came. So we stopped the fluids. Patient received IV Lasix. Patient breathing improved. Right now patient is on 2 L of oxygen saturating 97%. We stopped the Lasix. #3. Severe Alzheimer's dementia with agitation sometimes. Patient is on Ativan, Namenda, Exelon patch. #4 hypertension: Controlled. Patient is on metoprolol. 4. chronic atrial fibrillation: Rate controlled. She is on Eliquis, metoprolol. 5 hypokalemia secondary to diuretics: Replace the potassium. #6 mild acute on chronic renal failure: ATN secondary to dehydration: I stopped the Lasix. Increase fluid intake. Patient may have chronic kidney disease stage III:  Discharge Condition: stable   Follow UP      Discharge Instructions  and  Discharge Medications       Medication List    STOP taking these medications        loperamide 2 MG capsule  Commonly known as:  IMODIUM      TAKE these medications        acetaminophen 325 MG tablet  Commonly known as:  TYLENOL  Take 650 mg by mouth every 4 (four) hours as needed for mild pain or fever.     amLODipine 10 MG tablet  Commonly known as:  NORVASC  Take 10 mg by mouth daily.     bisacodyl 10 MG suppository  Commonly known as:  DULCOLAX  Place 1 suppository (10 mg total) rectally daily as needed for moderate constipation.  citalopram 10 MG tablet  Commonly known as:  CELEXA  Take 5 mg by mouth at bedtime.     ELIQUIS 2.5 MG Tabs tablet  Generic drug:  apixaban  Take 2.5 mg by mouth 2 (two) times daily.     Fluticasone-Salmeterol 100-50 MCG/DOSE Aepb  Commonly known as:  ADVAIR  Inhale 1 puff into the lungs 2 (two) times daily.     levalbuterol 1.25 MG/3ML nebulizer solution  Commonly known as:  XOPENEX  Take 1.25 mg  by nebulization every 4 (four) hours as needed for shortness of breath.     levofloxacin 750 MG tablet  Commonly known as:  LEVAQUIN  Take 1 tablet (750 mg total) by mouth daily.  Start taking on:  08/07/2015     LORazepam 0.5 MG tablet  Commonly known as:  ATIVAN  Take 0.5 mg by mouth every 6 (six) hours as needed for anxiety.     magnesium oxide 400 MG tablet  Commonly known as:  MAG-OX  Take 400 mg by mouth daily.     memantine 5 MG tablet  Commonly known as:  NAMENDA  Take 5 mg by mouth 2 (two) times daily.     metoprolol tartrate 25 MG tablet  Commonly known as:  LOPRESSOR  Take 25 mg by mouth 2 (two) times daily.     mometasone 50 MCG/ACT nasal spray  Commonly known as:  NASONEX  Place 2 sprays into the nose daily.     nizatidine 150 MG capsule  Commonly known as:  AXID  Take 150 mg by mouth daily.     omeprazole 20 MG capsule  Commonly known as:  PRILOSEC  Take 20 mg by mouth 2 (two) times daily.     polycarbophil 625 MG tablet  Commonly known as:  FIBERCON  Take 1,250 mg by mouth daily.     potassium chloride SA 20 MEQ tablet  Commonly known as:  K-DUR,KLOR-CON  Take 20 mEq by mouth 2 (two) times daily.     predniSONE 10 MG (21) Tbpk tablet  Commonly known as:  STERAPRED UNI-PAK 21 TAB  Take 1 tablet (10 mg total) by mouth daily. Taper by 10 mg daily     PRESERVISION AREDS Tabs  Take 1 tablet by mouth daily.     rivastigmine 9.5 mg/24hr  Commonly known as:  EXELON  Place 9.5 mg onto the skin every evening.     THEREMS Tabs  Take 1 tablet by mouth daily.     traMADol 50 MG tablet  Commonly known as:  ULTRAM  Take 50 mg by mouth every 6 (six) hours as needed for moderate pain.     vitamin B-12 1000 MCG tablet  Commonly known as:  CYANOCOBALAMIN  Take 1,000 mcg by mouth daily.          Diet and Activity recommendation: See Discharge Instructions above   Consults obtained - none  Major procedures and Radiology Reports - PLEASE review  detailed and final reports for all details, in brief -      Dg Chest 1 View  08/03/2015  CLINICAL DATA:  Shortness of breath and wheezing, worsening symptoms. On home oxygen, known atrial fibrillation, history of COPD EXAM: CHEST 1 VIEW COMPARISON:  Portable chest x-ray of August 01, 2015 FINDINGS: The lungs are adequately inflated. Subtle increased density present at both lung bases. Small amounts of pleural fluid are suspected bilaterally. The cardiac silhouette is mildly enlarged. The pulmonary vascularity is engorged and indistinct. The  trachea is midline. There is tortuosity of the descending thoracic aorta. IMPRESSION: COPD with superimposed CHF with mild pulmonary interstitial edema. Small bilateral pleural effusions are suspected. Minimal bibasilar atelectasis is suspected as well. Electronically Signed   By: David  Martinique M.D.   On: 08/03/2015 09:24   Dg Chest Portable 1 View  08/01/2015  CLINICAL DATA:  Shortness of breath for 1 day EXAM: PORTABLE CHEST 1 VIEW COMPARISON:  March 25, 2017 FINDINGS: There is no edema or consolidation. The heart is upper normal in size with pulmonary vascularity within normal limits. Aorta is diffusely tortuous but stable. There is atherosclerotic calcification in the aortic arch region. No adenopathy is apparent. There is an acute appearing fracture of the anterior right seventh rib. No pneumothorax. IMPRESSION: Acute fracture anterior right seventh rib. No pneumothorax. No edema or consolidation. Cardiac silhouette stable allowing for portable technique. Aorta is diffusely tortuous. Electronically Signed   By: Lowella Grip III M.D.   On: 08/01/2015 15:44    Micro Results     Recent Results (from the past 240 hour(s))  MRSA PCR Screening     Status: None   Collection Time: 08/01/15 11:00 PM  Result Value Ref Range Status   MRSA by PCR NEGATIVE NEGATIVE Final    Comment:        The GeneXpert MRSA Assay (FDA approved for NASAL specimens only), is  one component of a comprehensive MRSA colonization surveillance program. It is not intended to diagnose MRSA infection nor to guide or monitor treatment for MRSA infections.        Today   Subjective:   Raphaella Haskin today has no headache,no chest abdominal pain,no new weakness tingling or numbness, feels much better wants to go home today.   Objective:   Blood pressure 105/72, pulse 93, temperature 97.7 F (36.5 C), temperature source Oral, resp. rate 18, height 5\' 5"  (1.651 m), weight 47.401 kg (104 lb 8 oz), SpO2 98 %.   Intake/Output Summary (Last 24 hours) at 08/06/15 1000 Last data filed at 08/05/15 1040  Gross per 24 hour  Intake    150 ml  Output      0 ml  Net    150 ml    Exam Awake Alert, Oriented x 3, No new F.N deficits, Normal affect [place].AT,PERRAL Supple Neck,No JVD, No cervical lymphadenopathy appriciated.  Symmetrical Chest wall movement, Good air movement bilaterally, CTAB RRR,No Gallops,Rubs or new Murmurs, No Parasternal Heave +ve B.Sounds, Abd Soft, Non tender, No organomegaly appriciated, No rebound -guarding or rigidity. No Cyanosis, Clubbing or edema, No new Rash or bruise  Data Review   CBC w Diff: Lab Results  Component Value Date   WBC 9.4 08/05/2015   WBC 13.6* 10/09/2012   HGB 11.3* 08/05/2015   HGB 12.1 10/09/2012   HCT 33.1* 08/05/2015   HCT 35.3 10/09/2012   PLT 347 08/05/2015   PLT 433 10/09/2012   LYMPHOPCT 20 03/24/2015   MONOPCT 17 03/24/2015   EOSPCT 2 03/24/2015   BASOPCT 1 03/24/2015    CMP: Lab Results  Component Value Date   NA 140 08/06/2015   NA 135* 10/09/2012   K 3.4* 08/06/2015   K 3.2* 10/09/2012   CL 96* 08/06/2015   CL 95* 10/09/2012   CO2 38* 08/06/2015   CO2 32 10/09/2012   BUN 49* 08/06/2015   BUN 24* 10/09/2012   CREATININE 1.47* 08/06/2015   CREATININE 1.63* 10/09/2012   PROT 7.1 08/01/2015   PROT 8.0 10/09/2012  ALBUMIN 3.3* 08/01/2015   ALBUMIN 3.3* 10/09/2012   BILITOT 0.8 08/01/2015    BILITOT 0.3 10/09/2012   ALKPHOS 77 08/01/2015   ALKPHOS 93 10/09/2012   AST 58* 08/01/2015   AST 43* 10/09/2012   ALT 25 08/01/2015   ALT 24 10/09/2012  .   Total Time in preparing paper work, data evaluation and todays exam - 21 minutes  Norely Schlick M.D on 08/06/2015 at 10:00 AM    Note: This dictation was prepared with Dragon dictation along with smaller phrase technology. Any transcriptional errors that result from this process are unintentional.

## 2015-08-06 NOTE — Progress Notes (Signed)
MD making rounds. Discharge orders received to transfer to [facility]. IV discontinued. Refusing pneumo vaccine.  LCSW facilitating transfer to [facility]. Report called to Juliann Pulse, Therapist, sports at [facility]. No unanswered questions. EMS called for transport. EMS on Unit for transport. Discharged via EMS on 2L O2 via [place]. Belongings sent with EMS.

## 2015-08-06 NOTE — Clinical Social Work Note (Signed)
Pt is ready for discharge today to [facility]. CSW updated pt's niece, Manuela Schwartz, who was agreeable to [facility] [employer] plan. Pt will have Palliative Care NP will be following, referral has been made to [facility]. Facility has received all information and is ready to admit pt. RN to call report. [facility] will provide transportation. CSW is signing off as no further needs identified.   Darden Dates, MSW, LCSW  Clinical Social Worker  [phone]

## 2015-08-06 NOTE — NC FL2 (Signed)
[place] LEVEL OF CARE SCREENING TOOL     IDENTIFICATION  Patient Name: Janet Smith Birthdate: 04/12/1921 Sex: female Admission Date (Current Location): 08/01/2015  [place] and [place] Number:  Engineering geologist and Address:  [facility], [address], [place], [place] [address]      Provider Number: [id number]  Attending Physician Name and Address:  Epifanio Lesches, MD  Relative Name and Phone Number:       Current Level of Care: Hospital Recommended Level of Care: [place] Prior Approval Number:    Date Approved/Denied:   PASRR Number: [id number]  Discharge Plan: Home    Current Diagnoses: Patient Active Problem List   Diagnosis Date Noted  . Pressure ulcer 08/04/2015  . Malnutrition of moderate degree 08/03/2015  . COPD (chronic obstructive pulmonary disease) ([place]) 08/01/2015    Orientation RESPIRATION BLADDER Height & Weight     Self  Normal Incontinent Weight: 104 lb 8 oz (47.401 kg) Height:  5\' 5"  (165.1 cm)  BEHAVIORAL SYMPTOMS/MOOD NEUROLOGICAL BOWEL NUTRITION STATUS      Incontinent Diet (Dys 2, Thin Liquids)  AMBULATORY STATUS COMMUNICATION OF NEEDS Skin   Extensive Assist Verbally Normal                       Personal Care Assistance Level of Assistance  Bathing, Feeding, Dressing Bathing Assistance: Maximum assistance Feeding assistance: Maximum assistance Dressing Assistance: Maximum assistance     Functional Limitations Info  Sight, Hearing, Speech Sight Info: Impaired Hearing Info: Impaired Speech Info: Impaired    SPECIAL CARE FACTORS FREQUENCY                       Contractures      Additional Factors Info  Code Status, Allergies Code Status Info: DNR Allergies Info: Allergies: Cephalosporins, Codeine, Eggs Or Egg-derived Products, Fish Allergy, Influenza Vaccines, Penicillins, Sulfa Antibiotics           Current Medications (08/06/2015):  This is  the current hospital active medication list Current Facility-Administered Medications  Medication Dose Route Frequency Provider Last Rate Last Dose  . acetaminophen (TYLENOL) tablet 650 mg  650 mg Oral Q6H PRN Bettey Costa, MD   650 mg at 08/04/15 2221   Or  . acetaminophen (TYLENOL) suppository 650 mg  650 mg Rectal Q6H PRN Bettey Costa, MD      . albuterol (PROVENTIL) (2.5 MG/3ML) 0.083% nebulizer solution 2.5 mg  2.5 mg Nebulization TID Epifanio Lesches, MD   2.5 mg at 08/06/15 0808  . alum & mag hydroxide-simeth (MAALOX/MYLANTA) 200-200-20 MG/5ML suspension 30 mL  30 mL Oral Q6H PRN Sital Mody, MD      . amLODipine (NORVASC) tablet 10 mg  10 mg Oral Daily Sital Mody, MD   10 mg at 08/06/15 1200  . antiseptic oral rinse (CPC / CETYLPYRIDINIUM CHLORIDE 0.05%) solution 7 mL  7 mL Mouth Rinse q12n4p Bettey Costa, MD   7 mL at 08/05/15 2143  . apixaban (ELIQUIS) tablet 2.5 mg  2.5 mg Oral BID Bettey Costa, MD   5 mg at 08/06/15 1200  . bisacodyl (DULCOLAX) suppository 10 mg  10 mg Rectal Daily PRN Epifanio Lesches, MD   10 mg at 08/03/15 1210  . citalopram (CELEXA) tablet 5 mg  5 mg Oral QHS Bettey Costa, MD   5 mg at 08/05/15 2123  . docusate sodium (COLACE) capsule 100 mg  100 mg Oral BID Epifanio Lesches,  MD   100 mg at 08/06/15 1200  . [START ON 08/07/2015] levofloxacin (LEVAQUIN) tablet 750 mg  750 mg Oral Daily Epifanio Lesches, MD      . LORazepam (ATIVAN) injection 0.5 mg  0.5 mg Intravenous Once Epifanio Lesches, MD   0.5 mg at 08/03/15 1336  . LORazepam (ATIVAN) tablet 0.5 mg  0.5 mg Oral Q6H PRN Bettey Costa, MD   0.5 mg at 08/03/15 1244  . memantine (NAMENDA) tablet 5 mg  5 mg Oral BID Bettey Costa, MD   5 mg at 08/06/15 1200  . methylPREDNISolone sodium succinate (SOLU-MEDROL) 40 mg/mL injection 40 mg  40 mg Intravenous 3 times per day Epifanio Lesches, MD   40 mg at 08/06/15 0545  . metoprolol (LOPRESSOR) tablet 50 mg  50 mg Oral BID Epifanio Lesches, MD   50 mg at 08/05/15 2123   . mometasone-formoterol (DULERA) 100-5 MCG/ACT inhaler 2 puff  2 puff Inhalation BID Bettey Costa, MD   2 puff at 08/05/15 2121  . multivitamin (PROSIGHT) tablet 1 tablet  1 tablet Oral Daily Bettey Costa, MD   1 tablet at 08/06/15 1200  . multivitamin with minerals tablet 1 tablet  1 tablet Oral Daily Bettey Costa, MD   1 tablet at 08/06/15 1200  . ondansetron (ZOFRAN) tablet 4 mg  4 mg Oral Q6H PRN Bettey Costa, MD       Or  . ondansetron (ZOFRAN) injection 4 mg  4 mg Intravenous Q6H PRN Sital Mody, MD      . pantoprazole (PROTONIX) EC tablet 40 mg  40 mg Oral QAC breakfast Bettey Costa, MD   40 mg at 08/06/15 1159  . pneumococcal 23 valent vaccine (PNU-IMMUNE) injection 0.5 mL  0.5 mL Intramuscular Tomorrow-1000 Sital Mody, MD   0.5 mL at 08/02/15 1025  . polycarbophil (FIBERCON) tablet 1,250 mg  1,250 mg Oral Daily Bettey Costa, MD   1,250 mg at 08/06/15 1159  . potassium chloride SA (K-DUR,KLOR-CON) CR tablet 20 mEq  20 mEq Oral BID Bettey Costa, MD   20 mEq at 08/06/15 1159  . rivastigmine (EXELON) 9.5 mg/24hr 9.5 mg  9.5 mg Transdermal QPM Bettey Costa, MD   9.5 mg at 08/05/15 1734  . senna-docusate (Senokot-S) tablet 1 tablet  1 tablet Oral QHS PRN Bettey Costa, MD      . sodium chloride flush (NS) 0.9 % injection 3 mL  3 mL Intravenous Q12H Bettey Costa, MD   3 mL at 08/05/15 2130  . traMADol (ULTRAM) tablet 50 mg  50 mg Oral Q12H PRN Epifanio Lesches, MD      . vitamin B-12 (CYANOCOBALAMIN) tablet 1,000 mcg  1,000 mcg Oral Daily Bettey Costa, MD   1,000 mcg at 08/06/15 1200     Discharge Medications: Please see discharge summary for a list of discharge medications.  Relevant Imaging Results:  Relevant Lab Results:   Additional Information SSN:  [id number]  Darden Dates, LCSW

## 2015-09-13 DEATH — deceased

## 2017-09-04 IMAGING — CT CT HEAD W/O CM
4 series · 15 of 30 positions shown, 16 images · non-contrast
Comparison: CT of the head performed 03/24/2015

CLINICAL DATA: Status post fall while walking to bathroom. Patient
on Coumadin. Concern for head injury. Initial encounter.

EXAM:
CT HEAD WITHOUT CONTRAST
TECHNIQUE: Contiguous axial images were obtained from the base of the skull
through the vertex without intravenous contrast.

[Series 2: head wo · axial · 0.44mm/px · z∈[+993,+1065]mm · 3 of 34 slices shown, 4 images]
[im 9/34  brain]
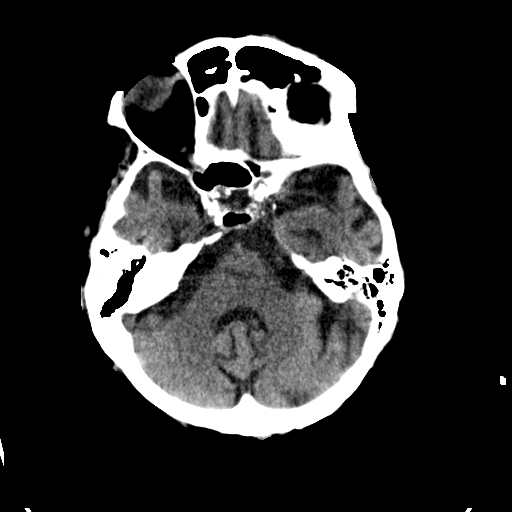
[im 9/34  bone]
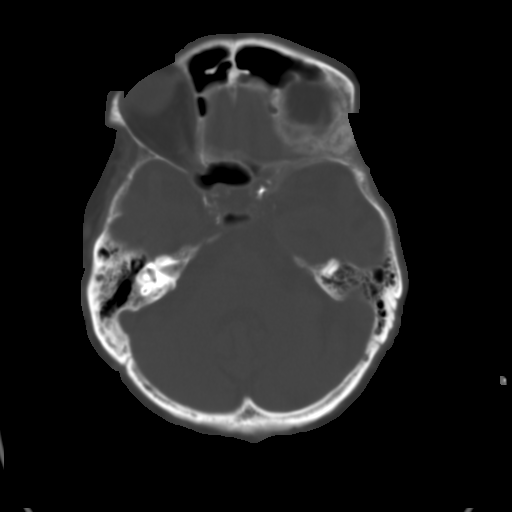
[im 17/34  brain]
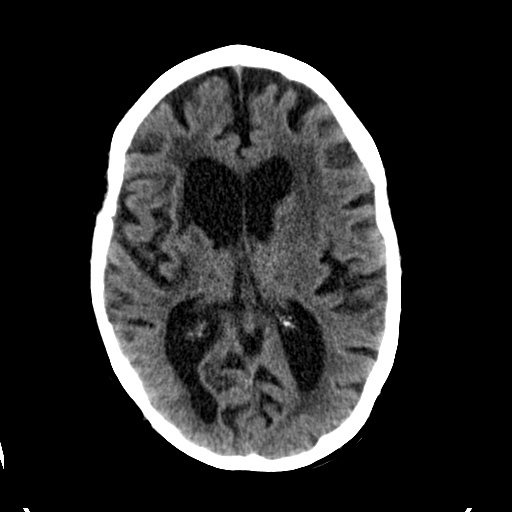
[im 25/34  brain]
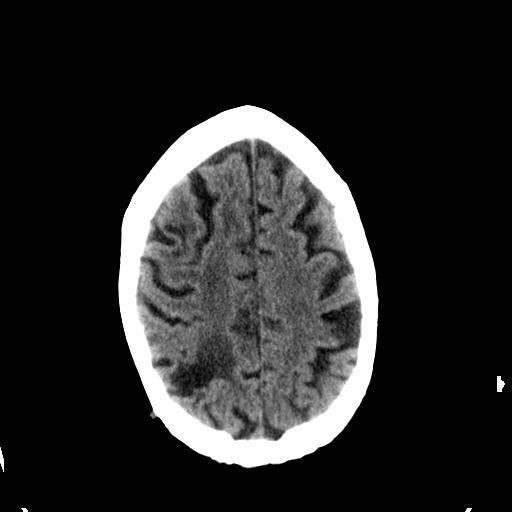

[Series 3: head bone · axial · 0.44mm/px · z∈[+965,+1106]mm · 8 of 108 slices shown]
[im 7/108  bone]
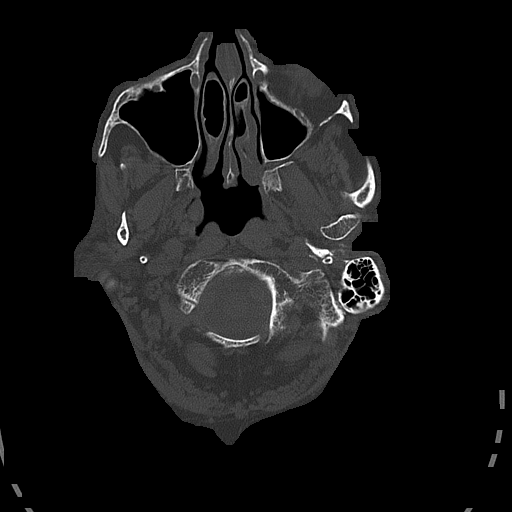
[im 21/108  bone]
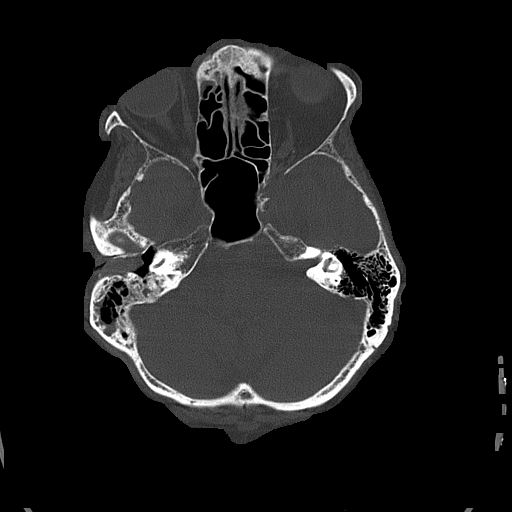
[im 34/108  bone]
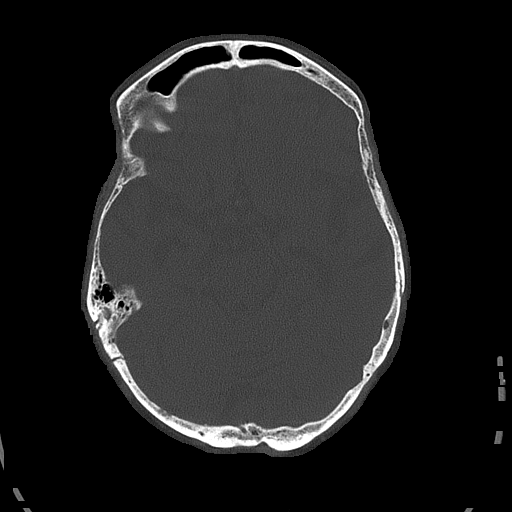
[im 47/108  bone]
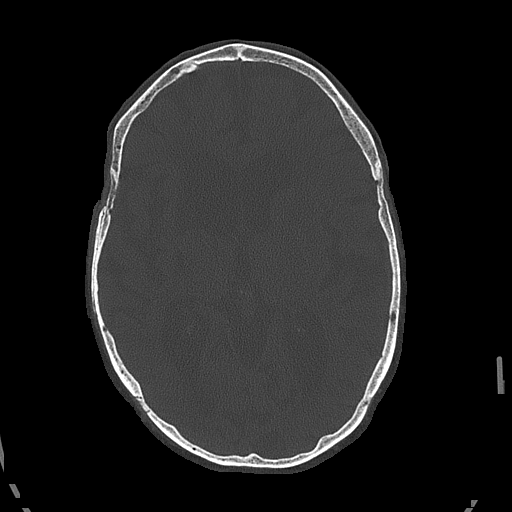
[im 61/108  bone]
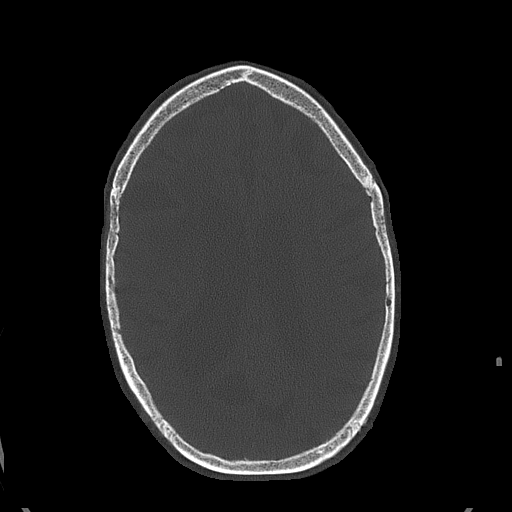
[im 74/108  bone]
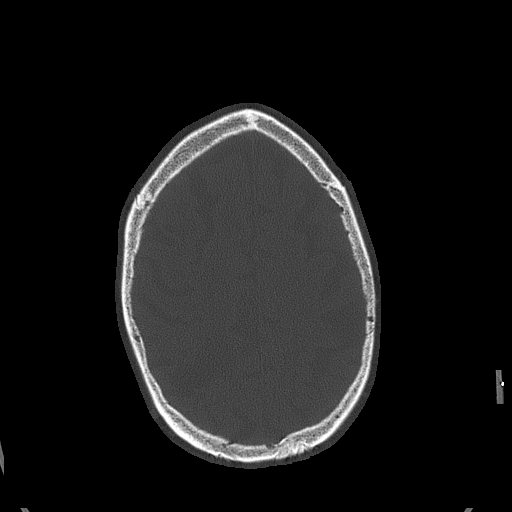
[im 87/108  bone]
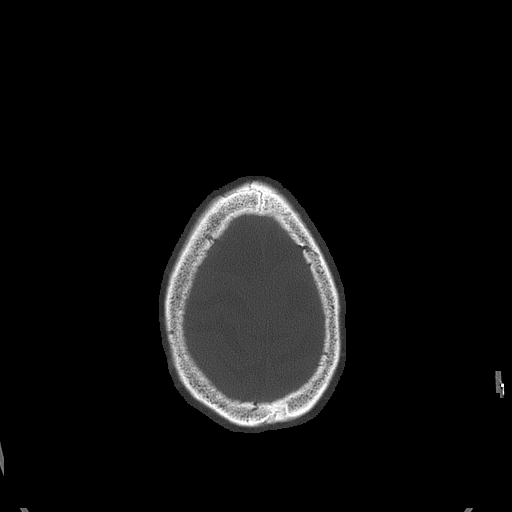
[im 101/108  bone]
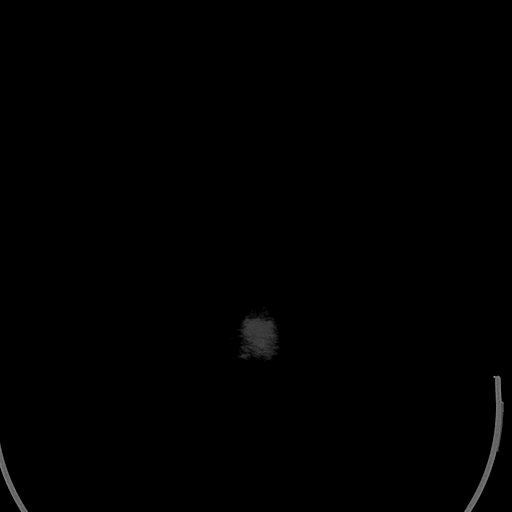

[Series 602: recon · axial · 0.51mm/px · z∈[+1050,+1092]mm · 2 of 27 slices shown]
[im 9/27  brain]
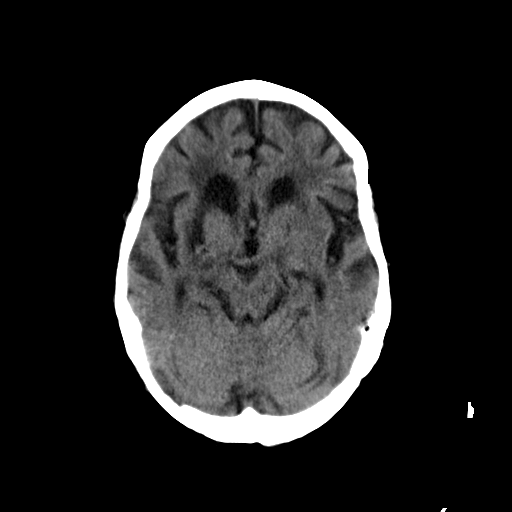
[im 18/27  brain]
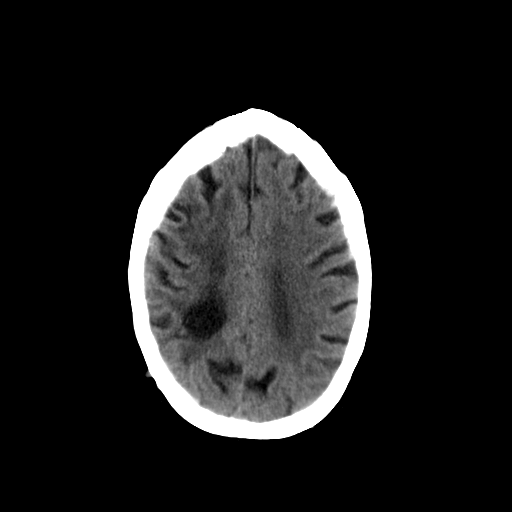

[Series 603: bone recon · axial · 0.51mm/px · z∈[+1042,+1084]mm · 2 of 27 slices shown]
[im 9/27  bone]
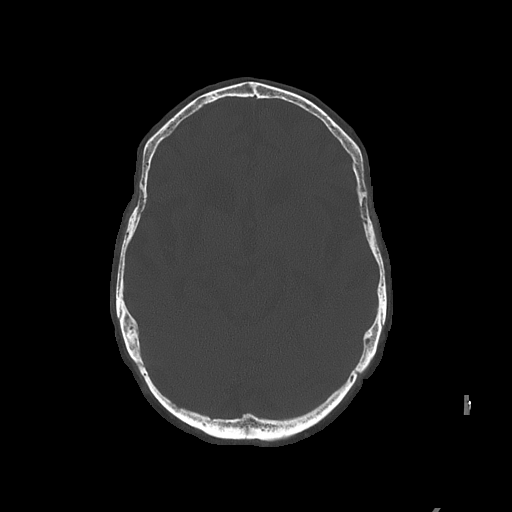
[im 18/27  bone]
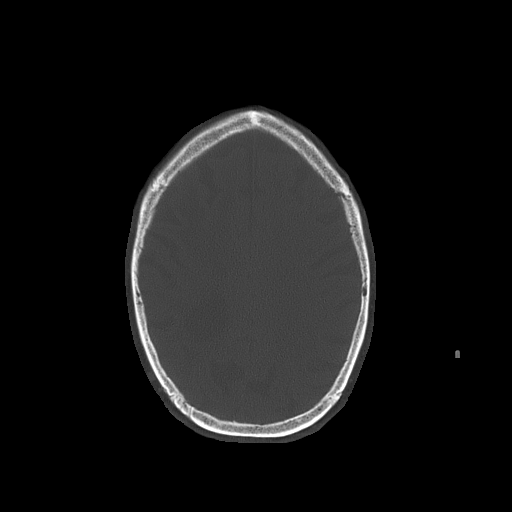

[15 of 30 positions shown; findings below may reference images not displayed]

FINDINGS: There is no evidence of acute infarction, mass lesion, or intra- or
extra-axial hemorrhage on CT.

Prominence of the ventricles and sulci reflects moderate cortical
volume loss. Cerebellar atrophy is noted. Chronic encephalomalacia
is noted at the high right frontal and parietal lobes, reflecting
remote infarct. Scattered periventricular and subcortical white
matter change likely reflects small vessel ischemic microangiopathy.
Chronic infarct is noted at the right basal ganglia.

The brainstem and fourth ventricle are within normal limits. No mass
effect or midline shift is seen.

There is no evidence of fracture; visualized osseous structures are
unremarkable in appearance. The orbits are within normal limits.
There is opacification of the right mastoid air cells. The paranasal
sinuses and left mastoid air cells are well-aerated. No significant
soft tissue abnormalities are seen.
IMPRESSION: 1. No evidence of traumatic intracranial injury or fracture.
2. Moderate cortical volume loss and scattered small vessel ischemic
microangiopathy.
3. Chronic encephalomalacia at the high right frontal and parietal
lobes, reflecting remote infarct. Chronic infarct at the right basal
ganglia.
4. Opacification of the right mastoid air cells.

## 2017-09-04 IMAGING — CR DG CHEST 2V
1 series · 3 of 3 positions shown · non-contrast
Comparison: Two days ago

CLINICAL DATA: Fall on Coumadin. Left thoracic pain. Initial
encounter.

EXAM:
CHEST  2 VIEW

[Series 1: w chest lat · 0.14mm/px · 3 of 3 slices shown]
[im 1/3]
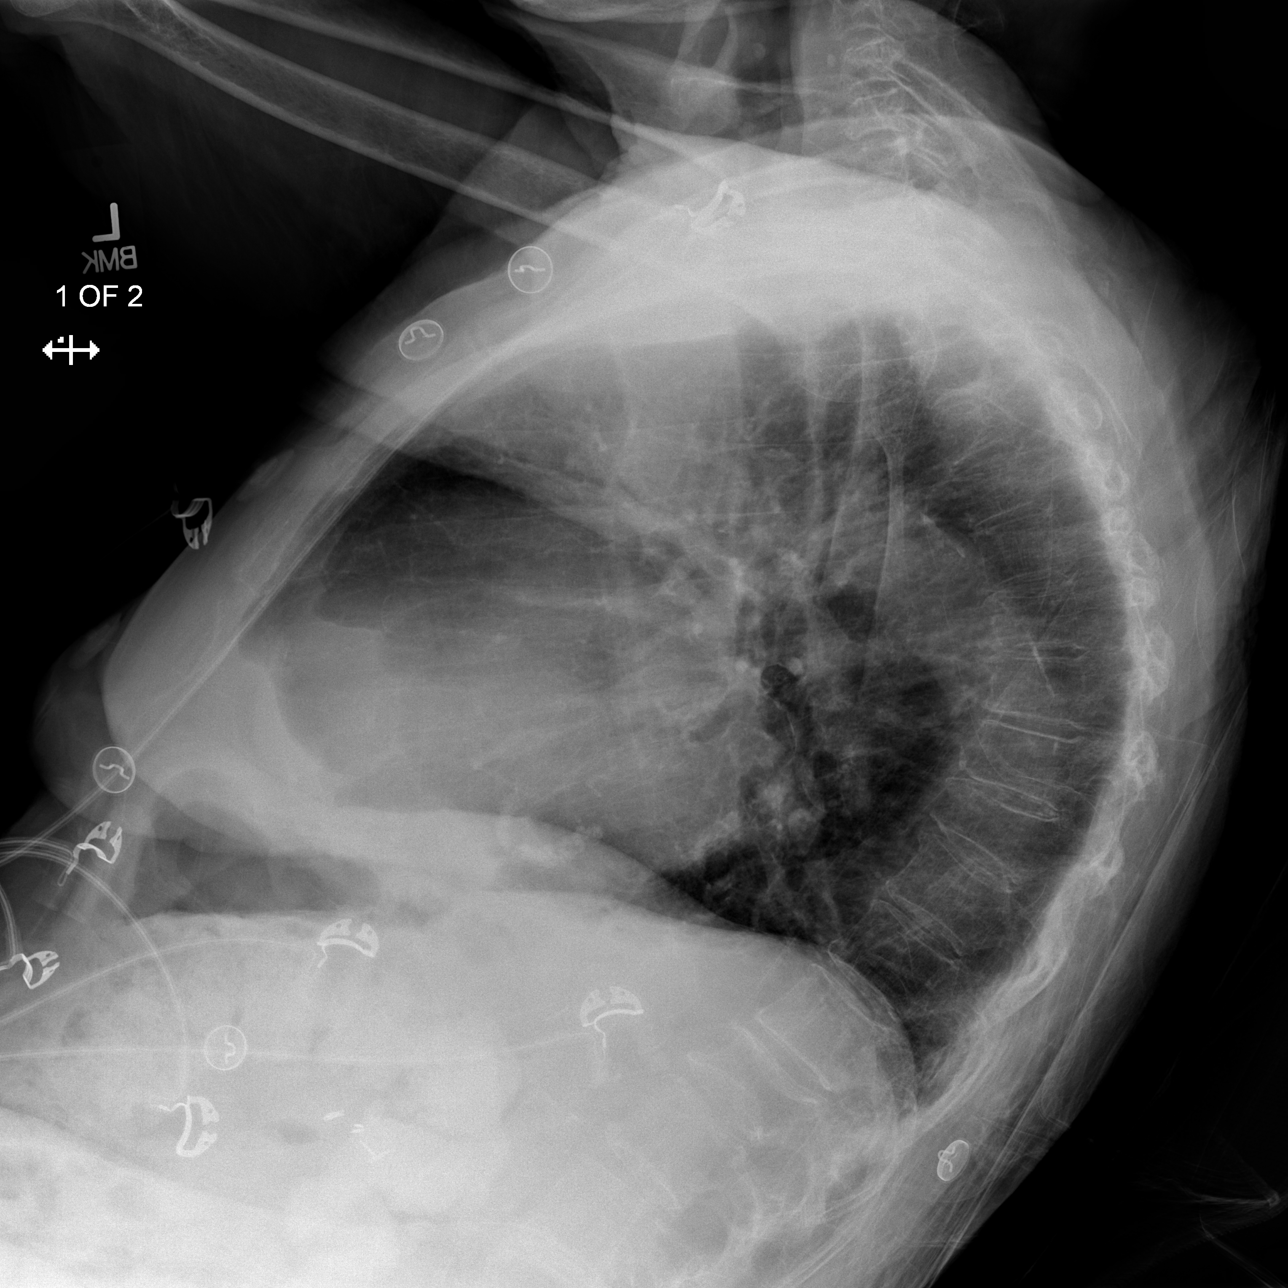
[im 2/3]
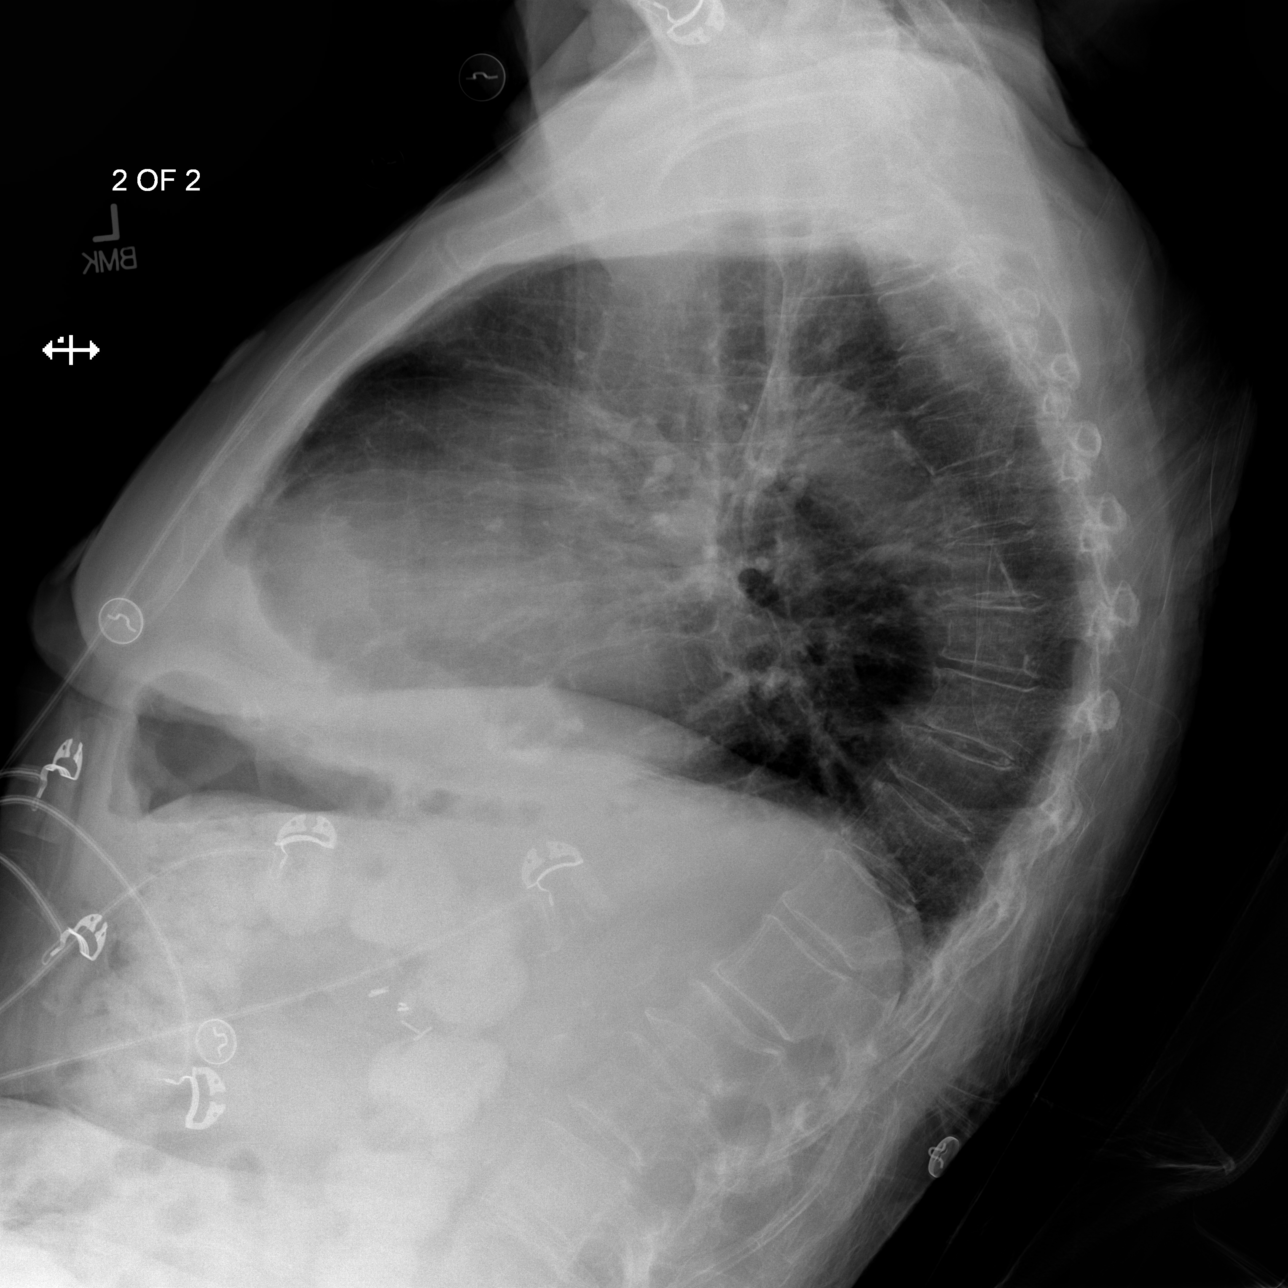
[im 3/3]
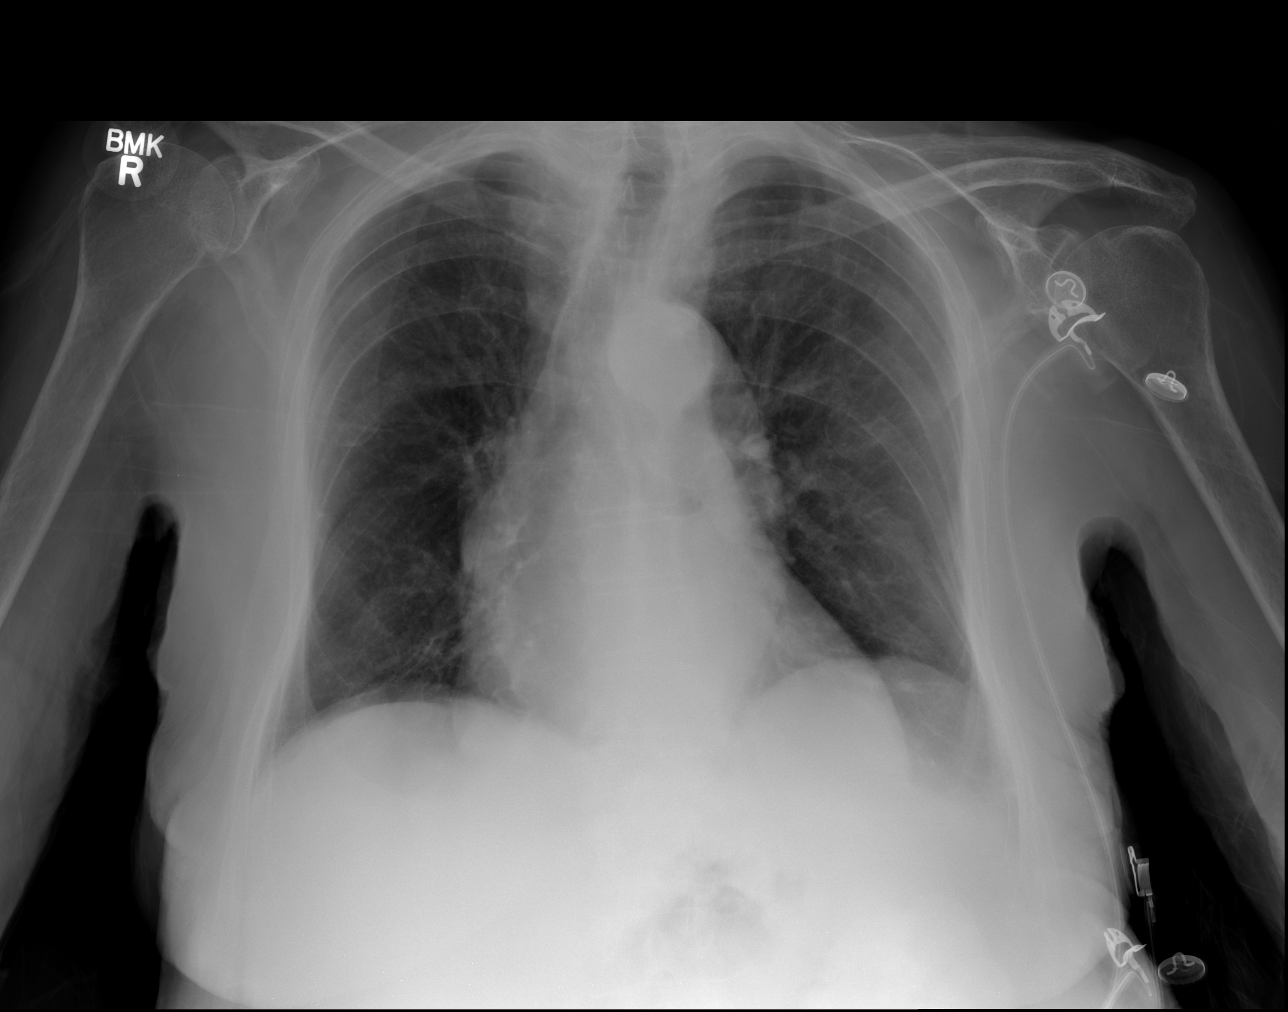

[3 of 3 positions shown; findings below may reference images not displayed]

FINDINGS: Chronic cardiomegaly and aortic tortuosity. There is no edema,
consolidation, effusion, or pneumothorax. Chronic T9 and L1
compression fractures with height loss stable from abdominal CT 2
days ago. No acute fracture identified.
IMPRESSION: No acute finding.
